# Patient Record
Sex: Male | Born: 1990 | Hispanic: Refuse to answer | Marital: Single | Smoking: Never smoker
Health system: Southern US, Community
[De-identification: ages and names within clinical notes are randomized; demographics above are authoritative.]

## PROBLEM LIST (undated history)

## (undated) DIAGNOSIS — H7290 Unspecified perforation of tympanic membrane, unspecified ear: Secondary | ICD-10-CM

## (undated) DIAGNOSIS — F845 Asperger's syndrome: Secondary | ICD-10-CM

## (undated) HISTORY — DX: Unspecified perforation of tympanic membrane, unspecified ear: H72.90

## (undated) HISTORY — PX: CIRCUMCISION: SUR203

## (undated) HISTORY — PX: GANGLION CYST EXCISION: SHX1691

## (undated) HISTORY — DX: Asperger's syndrome: F84.5

---
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
## 2009-01-05 ENCOUNTER — Ambulatory Visit: Payer: Self-pay | Admitting: Internal Medicine

## 2009-01-05 DIAGNOSIS — F900 Attention-deficit hyperactivity disorder, predominantly inattentive type: Secondary | ICD-10-CM | POA: Insufficient documentation

## 2009-01-05 LAB — CONVERTED CEMR LAB
ALT: 12 units/L (ref 0–53)
Albumin: 4.8 g/dL (ref 3.5–5.2)
BUN: 9 mg/dL (ref 6–23)
Basophils Absolute: 0 10*3/uL (ref 0.0–0.1)
CO2: 32 meq/L (ref 19–32)
Chloride: 103 meq/L (ref 96–112)
Cholesterol: 113 mg/dL (ref 0–200)
Glucose, Bld: 74 mg/dL (ref 70–99)
HCT: 45.3 % (ref 39.0–52.0)
Hemoglobin, Urine: NEGATIVE
LDL Cholesterol: 58 mg/dL (ref 0–99)
Lymphs Abs: 2 10*3/uL (ref 0.7–4.0)
MCV: 95.4 fL (ref 78.0–100.0)
Monocytes Absolute: 0.5 10*3/uL (ref 0.1–1.0)
Neutro Abs: 2.6 10*3/uL (ref 1.4–7.7)
Nitrite: NEGATIVE
Platelets: 272 10*3/uL (ref 150.0–400.0)
Potassium: 4.6 meq/L (ref 3.5–5.1)
RDW: 12.9 % (ref 11.5–14.6)
Total Bilirubin: 1 mg/dL (ref 0.3–1.2)
Total Protein, Urine: 30 mg/dL
Triglycerides: 133 mg/dL (ref 0.0–149.0)
Urine Glucose: NEGATIVE mg/dL
VLDL: 26.6 mg/dL (ref 0.0–40.0)
pH: 7 (ref 5.0–8.0)

## 2010-10-14 NOTE — Op Note (Signed)
[facility]  Patient:    Craig Weeks                      MRN: [id number] Proc. Date: 06/29/99 Adm. Date:  [id number] Disc. Date: [id number] Attending:  Katherine Roan CC:         Elana Alm. Eliezer Lofts., M.D.                           Operative Report  PREOPERATIVE DIAGNOSIS:  Phimosis.  POSTOPERATIVE DIAGNOSIS:  Phimosis.  OPERATION PERFORMED:  Circumcision.  SURGEON:  Rozanna Boer., M.D.  ANESTHESIA:  General.  INDICATIONS FOR PROCEDURE:  This 20-year-old white male was admitted with phimosis for elective circumcision.  He had his first and only urinary tract infection in December but does have dysuria, split stream with urination off and on.  He was  eneuretic until August 6.  He is admitted now for elective circumcision.  DESCRIPTION OF PROCEDURE:  The patient was placed on the operating table in supine position.  After satisfactory induction of general anesthesia, he was prepped and draped in the usual sterile fashion.  The redundant skin was marked off with a marking pen, incised circumferentially and then a dorsal slit was done and the rep was completed.  Another circumferential incision was made around the coronal sulcus and the redundant skin was then removed and hemostasis achieved by electrocoagulation with a Bovie with a fine needle electrode.  The edges of the  skin were then reapproximated with interrupted 5-0 chromic catgut suture and the base of the penis was infiltrated with 0.5 cc of 0.25% Marcaine. A dressing of collodion, Vaseline gauze, sterile dry bandage and Coban was applied.  The patient was taken to the recovery room in good condition and will be later discharged as an outpatient with detailed written instructions. DD:  06/29/99 TD:  06/29/99 Job: [id number]

## 2012-05-01 ENCOUNTER — Other Ambulatory Visit (INDEPENDENT_AMBULATORY_CARE_PROVIDER_SITE_OTHER): Payer: BC Managed Care – PPO

## 2012-05-01 ENCOUNTER — Ambulatory Visit: Payer: BC Managed Care – PPO

## 2012-05-01 ENCOUNTER — Encounter: Payer: Self-pay | Admitting: Internal Medicine

## 2012-05-01 ENCOUNTER — Ambulatory Visit (INDEPENDENT_AMBULATORY_CARE_PROVIDER_SITE_OTHER): Payer: BC Managed Care – PPO | Admitting: Internal Medicine

## 2012-05-01 ENCOUNTER — Other Ambulatory Visit: Payer: Self-pay | Admitting: Internal Medicine

## 2012-05-01 VITALS — BP 118/78 | HR 75 | Temp 98.6°F | Ht 73.0 in | Wt 197.2 lb

## 2012-05-01 DIAGNOSIS — F988 Other specified behavioral and emotional disorders with onset usually occurring in childhood and adolescence: Secondary | ICD-10-CM

## 2012-05-01 DIAGNOSIS — F411 Generalized anxiety disorder: Secondary | ICD-10-CM

## 2012-05-01 DIAGNOSIS — F419 Anxiety disorder, unspecified: Secondary | ICD-10-CM

## 2012-05-01 DIAGNOSIS — F848 Other pervasive developmental disorders: Secondary | ICD-10-CM

## 2012-05-01 DIAGNOSIS — Z0000 Encounter for general adult medical examination without abnormal findings: Secondary | ICD-10-CM

## 2012-05-01 DIAGNOSIS — F845 Asperger's syndrome: Secondary | ICD-10-CM

## 2012-05-01 DIAGNOSIS — R945 Abnormal results of liver function studies: Secondary | ICD-10-CM

## 2012-05-01 DIAGNOSIS — R7401 Elevation of levels of liver transaminase levels: Secondary | ICD-10-CM

## 2012-05-01 DIAGNOSIS — F840 Autistic disorder: Secondary | ICD-10-CM | POA: Insufficient documentation

## 2012-05-01 HISTORY — DX: Asperger's syndrome: F84.5

## 2012-05-01 LAB — CBC WITH DIFFERENTIAL/PLATELET
Basophils Absolute: 0 10*3/uL (ref 0.0–0.1)
Eosinophils Absolute: 0.1 10*3/uL (ref 0.0–0.7)
Lymphs Abs: 1.8 10*3/uL (ref 0.7–4.0)
MCHC: 34 g/dL (ref 30.0–36.0)
MCV: 91.4 fl (ref 78.0–100.0)
Monocytes Absolute: 0.5 10*3/uL (ref 0.1–1.0)
Neutrophils Relative %: 59 % (ref 43.0–77.0)
Platelets: 322 10*3/uL (ref 150.0–400.0)
RDW: 14.1 % (ref 11.5–14.6)
WBC: 5.9 10*3/uL (ref 4.5–10.5)

## 2012-05-01 LAB — BASIC METABOLIC PANEL
BUN: 11 mg/dL (ref 6–23)
Chloride: 103 mEq/L (ref 96–112)
Potassium: 4.1 mEq/L (ref 3.5–5.1)
Sodium: 139 mEq/L (ref 135–145)

## 2012-05-01 LAB — URINALYSIS, ROUTINE W REFLEX MICROSCOPIC
Bilirubin Urine: NEGATIVE
Hgb urine dipstick: NEGATIVE
Nitrite: NEGATIVE
Urobilinogen, UA: 0.2 (ref 0.0–1.0)

## 2012-05-01 LAB — LIPID PANEL
Cholesterol: 153 mg/dL (ref 0–200)
LDL Cholesterol: 100 mg/dL — ABNORMAL HIGH (ref 0–99)

## 2012-05-01 LAB — HEPATIC FUNCTION PANEL
ALT: 135 U/L — ABNORMAL HIGH (ref 0–53)
AST: 88 U/L — ABNORMAL HIGH (ref 0–37)
Alkaline Phosphatase: 103 U/L (ref 39–117)
Bilirubin, Direct: 0.1 mg/dL (ref 0.0–0.3)
Total Bilirubin: 0.5 mg/dL (ref 0.3–1.2)

## 2012-05-01 NOTE — Assessment & Plan Note (Signed)
With anxiety and ADD, pt requests referral to [facility] in [place], has tried to self -refer and unable to do this without paper referral per mom

## 2012-05-01 NOTE — Assessment & Plan Note (Signed)

## 2012-05-01 NOTE — Progress Notes (Signed)
Subjective:    Patient ID: Craig Weeks, male    DOB: Jun 28, 1990, 21 y.o.   MRN: [id number]  HPI  Here for wellness and f/u;  Overall doing ok;  Pt denies CP, worsening SOB, DOE, wheezing, orthopnea, PND, worsening LE edema, palpitations, dizziness or syncope.  Pt denies neurological change such as new Headache, facial or extremity weakness.  Pt denies polydipsia, polyuria, or low sugar symptoms. Pt states overall good compliance with treatment and medications, good tolerability, and trying to follow lower cholesterol diet.  Pt denies worsening depressive symptoms, suicidal ideation or panic. No fever, wt loss, night sweats, loss of appetite, or other constitutional symptoms.  Pt states good ability with ADL's, low fall risk, home safety reviewed and adequate, no significant changes in hearing or vision, and occasionally active with exercise.  Asks for psychiatry as he and mother have become unhappy with current.  Does not need daytrana rx for now Past Medical History  Diagnosis Date  . Asperger's disorder 05/01/2012   Past Surgical History  Procedure Date  . Ganglion cyst excision     right    reports that he has never smoked. He has never used smokeless tobacco. He reports that he drinks alcohol. He reports that he does not use illicit drugs. family history includes ADD / ADHD in his father. Allergies  Allergen Reactions  . Milk-Related Compounds     REACTION: gas,bloating, stomach hurts   Current Outpatient Prescriptions on File Prior to Visit  Medication Sig Dispense Refill  . methylphenidate (DAYTRANA) 30 MG/9HR Place 1 patch onto the skin daily. wear patch for 9 hours only each day       Review of Systems Review of Systems  Constitutional: Negative for diaphoresis, activity change, appetite change and unexpected weight change.  HENT: Negative for hearing loss, ear pain, facial swelling, mouth sores and neck stiffness.   Eyes: Negative for pain, redness and visual disturbance.   Respiratory: Negative for shortness of breath and wheezing.   Cardiovascular: Negative for chest pain and palpitations.  Gastrointestinal: Negative for diarrhea, blood in stool, abdominal distention and rectal pain.  Genitourinary: Negative for hematuria, flank pain and decreased urine volume.  Musculoskeletal: Negative for myalgias and joint swelling.  Skin: Negative for color change and wound.  Neurological: Negative for syncope and numbness.  Hematological: Negative for adenopathy.  Psychiatric/Behavioral: Negative for hallucinations, self-injury, decreased concentration and agitation.      Objective:   Physical Exam BP 118/78  Pulse 75  Temp 98.6 F (37 C) (Oral)  Ht 6\' 1"  (1.854 m)  Wt 197 lb 4 oz (89.472 kg)  BMI 26.02 kg/m2  SpO2 96% Physical Exam  VS noted Constitutional: Pt is oriented to person, place, and time. Appears well-developed and well-nourished.  HENT:  Head: Normocephalic and atraumatic.  Right Ear: External ear normal.  Left Ear: External ear normal.  Nose: Nose normal.  Mouth/Throat: Oropharynx is clear and moist.  Eyes: Conjunctivae and EOM are normal. Pupils are equal, round, and reactive to light.  Neck: Normal range of motion. Neck supple. No JVD present. No tracheal deviation present.  Cardiovascular: Normal rate, regular rhythm, normal heart sounds and intact distal pulses.   Pulmonary/Chest: Effort normal and breath sounds normal.  Abdominal: Soft. Bowel sounds are normal. There is no tenderness.  Musculoskeletal: Normal range of motion. Exhibits no edema.  Lymphadenopathy:  Has no cervical adenopathy.  Neurological: Pt is alert and oriented to person, place, and time. Pt has normal reflexes. No cranial  nerve deficit.  Skin: Skin is warm and dry. No rash noted.  Psychiatric:  Has  normal mood and affect. Behavior is normal. except 1+ nervous    Assessment & Plan:

## 2012-05-01 NOTE — Patient Instructions (Addendum)
Please consider returning if you change your mind about the flu shot Continue all other medications as before Please continue your efforts at being more active, low cholesterol diet, and weight control. You are otherwise up to date with prevention measures Please go to LAB in the Basement for the blood and/or urine tests to be done today You will be contacted by phone if any changes need to be made immediately.  Otherwise, you will receive a letter about your results with an explanation, but please check with MyChart first. Thank you for enrolling in MyChart.  MyChart allows you to send messages to your doctor, view your test results, renew your prescriptions, schedule appointments, and more. To Log into MyChart, please go to [email or web address], and your Username is:  [email or web address] You will be contacted regarding the referral for: [facility] in [place] Please return in 1 year for your yearly visit, or sooner if needed, with Lab testing done 3-5 days before

## 2012-05-02 LAB — HEPATITIS PANEL, ACUTE
HCV Ab: NEGATIVE
Hep A IgM: NEGATIVE
Hepatitis B Surface Ag: NEGATIVE

## 2012-05-27 ENCOUNTER — Ambulatory Visit
Admission: RE | Admit: 2012-05-27 | Discharge: 2012-05-27 | Disposition: A | Discharge status: Home / Self Care | Payer: BC Managed Care – PPO | Source: Ambulatory Visit | Attending: Internal Medicine | Admitting: Internal Medicine

## 2012-05-27 DIAGNOSIS — R945 Abnormal results of liver function studies: Secondary | ICD-10-CM

## 2012-05-30 ENCOUNTER — Telehealth: Payer: Self-pay | Admitting: *Deleted

## 2012-05-30 NOTE — Telephone Encounter (Signed)
See previous results note robin has called...Raechel Chute

## 2012-05-30 NOTE — Telephone Encounter (Signed)
Left msg on triage wanting results back from [facility]/S.../lmb

## 2012-06-03 ENCOUNTER — Ambulatory Visit (HOSPITAL_COMMUNITY): Payer: BC Managed Care – PPO | Admitting: Psychiatry

## 2012-06-24 ENCOUNTER — Ambulatory Visit (HOSPITAL_COMMUNITY): Payer: BC Managed Care – PPO | Admitting: Psychiatry

## 2012-06-25 ENCOUNTER — Encounter (HOSPITAL_COMMUNITY): Payer: Self-pay | Admitting: Psychiatry

## 2012-06-25 ENCOUNTER — Ambulatory Visit (INDEPENDENT_AMBULATORY_CARE_PROVIDER_SITE_OTHER): Payer: BC Managed Care – PPO | Admitting: Psychiatry

## 2012-06-25 VITALS — BP 107/67 | HR 65 | Ht 73.5 in | Wt 203.0 lb

## 2012-06-25 DIAGNOSIS — F988 Other specified behavioral and emotional disorders with onset usually occurring in childhood and adolescence: Secondary | ICD-10-CM

## 2012-06-25 DIAGNOSIS — F840 Autistic disorder: Secondary | ICD-10-CM

## 2012-06-25 DIAGNOSIS — F900 Attention-deficit hyperactivity disorder, predominantly inattentive type: Secondary | ICD-10-CM

## 2012-06-25 MED ORDER — METHYLPHENIDATE 30 MG/9HR TD PTCH: 1.0000 | MEDICATED_PATCH | Freq: Every day | TRANSDERMAL | Status: DC

## 2012-06-25 NOTE — Progress Notes (Signed)
Psychiatric Assessment Adult  Patient Identification:  Craig Weeks Date of Evaluation:  06/25/2012 Chief Complaint:   History of Chief Complaint:   Chief Complaint  Patient presents with  . Other    Asperger's    HPI Comments: Craig Weeks is a 22  y/o male with a past psychiatric history significant for Asperger's Disorder, and Attention Deficit Hyperactivity, disorder Inattentive type. The patient is referred for psychiatric services for psychiatric evaluation and medication management.   The patient reports that his main stressors are: "work"-having difficulty with work he has a problems with checking behaviors which has interfered with work. The patient reports he is looking forward to saving enough money to move out of his.   In the area of affective symptoms, patient appears . Patient denies current suicidal ideation, intent, or plan. Patient denies current homicidal ideation, intent, or plan. Patient denies auditory hallucinations. Patient denies visual hallucinations. Patient denies symptoms of paranoia. Patient states sleep is good, with approximately 10 hours of sleep per night. Appetite is decreased when medication is working. Energy level is poor. Patient denies symptoms of anhedonia. Patient denies hopelessness, helplessness, or guilt.   Denies any recent episodes consistent with mania, particularly decreased need for sleep with increased energy, grandiosity, impulsivity, hyperverbal and pressured speech, or increased productivity.  Denies any recent symptoms consistent with psychosis, particularly auditory or visual hallucinations, thought broadcasting/insertion/withdrawal, or ideas of reference. He reports one episode of excessive worry to the point of physical symptoms and panic attacks at a convention-[employer].  Denies any history of trauma or symptoms consistent with PTSD such as flashbacks, nightmares, hypervigilance, feelings of numbness or inability to connect with  others.    Review of Systems  Constitutional: Positive for appetite change. Negative for fever, chills and fatigue.  Respiratory: Negative for cough, chest tightness and shortness of breath.   Cardiovascular: Negative for chest pain, palpitations and leg swelling.  Gastrointestinal: Negative for nausea, vomiting, abdominal pain, diarrhea and constipation.  Neurological: Negative for dizziness, light-headedness and headaches.   Filed Vitals:   06/25/12 1401  BP: 107/67  Pulse: 65  Height: 6' 1.5" (1.867 m)  Weight: 203 lb (92.08 kg)   Physical Exam  Vitals reviewed. Constitutional: He appears well-developed and well-nourished. No distress.  Skin: He is not diaphoretic.   Traumatic Brain Injury: Negative   Past Psychiatric History: Diagnosis: Asperger's Disorder-at age 12, ADHD  Hospitalizations: Patient denies.  Outpatient Care: Patient reports that his only psychiatrist was Dr. Tomasa Weeks  Substance Abuse Care: Patient denies.  Self-Mutilation: Patient denies.  Suicidal Attempts: Patient denies.  Violent Behaviors: Age 89 fight.   Past Medical History:   Past Medical History  Diagnosis Date  . Asperger's disorder 05/01/2012   History of Loss of Consciousness:  Negative Seizure History:  Negative Cardiac History:  Negative Allergies:   Allergies  Allergen Reactions  . Milk-Related Compounds     REACTION: gas,bloating, stomach hurts   Current Medications:  Current Outpatient Prescriptions  Medication Sig Dispense Refill  . methylphenidate (DAYTRANA) 30 MG/9HR Place 1 patch onto the skin daily. wear patch for 9 hours only each day        Previous Psychotropic Medications:  Medication   Daytrana  Concerta   Substance Abuse History in the last 12 months: Caffeine: Patient denies. Tobacco: Patient denies. Alcohol: Patient denies. Illicit Drugs: Patient denies.   Medical Consequences of Substance Abuse:Patient denies.  Legal Consequences of Substance Abuse:  Patient denies.  Family Consequences of Substance Abuse: Patient denies.  Blackouts:  Negative DT's:  Not Applicable Withdrawal Symptoms:  Not Applicable  Social History: Current Place of Residence: [place], [place] Place of Birth: [place], [place] Family Members: Mother, father, 1 older brothers Marital Status:  Single Children: None Relationships: Patient reports he has no main source of emotional support Education:  HS Graduate Educational Problems/Performance: Did fair in school. Religious Beliefs/Practices: None History of Abuse: none Occupational Experiences; Military History:  None. Legal History:Patient  Hobbies/Interests: Tabletop games.  Family History:   Family History  Problem Relation Age of Onset  . ADD / ADHD Father   . Prostate cancer Maternal Grandfather   . Bipolar disorder Maternal Grandmother   . Alcohol abuse Maternal Grandmother   . Dementia Paternal Grandfather   . 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 Affirm with the patient that the medications are taken as ordered. Patient expressed understanding of how their medications were to be used.  2. Continue the following psychiatric medications as written prior to this appointment with the following changes:  a) Continue Daytrana 40 mg daily. 3. Therapy: brief supportive therapy provided. Continue current services.  4. Risks and benefits, side effects and alternatives discussed with patient, he was given an opportunity to ask questions about his/her medication, illness, and treatment. All current psychiatric medications have been reviewed and discussed with the patient and adjusted as clinically appropriate. The patient has been provided an accurate and updated list of the medications being now prescribed.  5. Patient told to call clinic if any problems occur. Patient advised to go to ER  if she should develop SI/HI, side effects, or if symptoms worsen. Has crisis numbers to call if needed.   6. Will perform UDS. 7. The patient was encouraged to keep all PCP and specialty clinic appointments.  8. Patient was instructed to return to clinic in 1 month.  9. The patient was advised to call and cancel their mental health appointment within 24 hours of the appointment, if they are unable to keep the appointment, as well as the three no show and termination from clinic policy. 10. The patient expressed understanding of the plan and agrees with the above.  Jacqulyn Cane, MD 1/28/20142:16 PM

## 2012-06-26 LAB — DRUG SCREEN, URINE
Amphetamine Screen, Ur: NEGATIVE
Benzodiazepines.: NEGATIVE
Creatinine,U: 35.05 mg/dL
Methadone: NEGATIVE
Propoxyphene: NEGATIVE

## 2012-07-08 ENCOUNTER — Ambulatory Visit (INDEPENDENT_AMBULATORY_CARE_PROVIDER_SITE_OTHER): Payer: BC Managed Care – PPO | Admitting: Psychiatry

## 2012-07-08 ENCOUNTER — Encounter (HOSPITAL_COMMUNITY): Payer: Self-pay | Admitting: Psychiatry

## 2012-07-08 VITALS — BP 129/77 | HR 70 | Ht 73.6 in | Wt 204.0 lb

## 2012-07-08 DIAGNOSIS — F900 Attention-deficit hyperactivity disorder, predominantly inattentive type: Secondary | ICD-10-CM

## 2012-07-08 DIAGNOSIS — F988 Other specified behavioral and emotional disorders with onset usually occurring in childhood and adolescence: Secondary | ICD-10-CM

## 2012-07-08 DIAGNOSIS — F840 Autistic disorder: Secondary | ICD-10-CM

## 2012-07-08 MED ORDER — METHYLPHENIDATE 30 MG/9HR TD PTCH: 1.0000 | MEDICATED_PATCH | Freq: Every day | TRANSDERMAL | Status: DC

## 2012-07-08 NOTE — Progress Notes (Signed)
[facility] Follow-up Outpatient Visit  TANNON PEERSON [birth year]  Date: 07/08/2012  History of Chief Complaint:   Chief Complaint  Patient presents with  . Follow-up     HPI Comments: Mr. Prosch is a 22  y/o male with a past psychiatric history significant for Asperger's Disorder, and Attention Deficit Hyperactivity, disorder Inattentive type. The patient is referred for psychiatric services for medication management.   The patient reports he is dong well at work. He reports he is taking his medications and denies an side effects. He continues to work and denies any problems at work. He is saving money in hopes of moving out of his house and living on his own.  In the area of affective symptoms, patient appears . Patient denies current suicidal ideation, intent, or plan. Patient denies current homicidal ideation, intent, or plan. Patient denies auditory hallucinations. Patient denies visual hallucinations. Patient denies symptoms of paranoia. Patient states sleep is good, with approximately 8-10 hours of sleep per night. Appetite is decreased when medication is working. Energy level is good. Patient denies symptoms of anhedonia. Patient denies hopelessness, helplessness, or guilt.   Denies any recent episodes consistent with mania, particularly decreased need for sleep with increased energy, grandiosity, impulsivity, hyperverbal and pressured speech, or increased productivity.  Denies any recent symptoms consistent with psychosis, particularly auditory or visual hallucinations, thought broadcasting/insertion/withdrawal, or ideas of reference. He reports one episode of excessive worry to the point of physical symptoms and panic attacks.  Denies any history of trauma or symptoms consistent with PTSD such as flashbacks, nightmares, hypervigilance, feelings of numbness or inability to connect with others.   Spoke with the patient's mother for collateral information. She denies any  concerns.  She requests a 90 day supply prescription due to their insurance.  Review of Systems  Constitutional: Positive for appetite change. Negative for fever, chills and fatigue.  Respiratory: Negative for cough, chest tightness and shortness of breath.   Cardiovascular: Negative for chest pain, palpitations and leg swelling.  Gastrointestinal: Negative for nausea, vomiting, abdominal pain, diarrhea and constipation.  Neurological: Negative for dizziness, light-headedness and headaches.   Filed Vitals:   07/08/12 1551  BP: 129/77  Pulse: 70  Height: 6' 1.6" (1.869 m)  Weight: 204 lb (92.534 kg)   Physical Exam  Vitals reviewed. Constitutional: He appears well-developed and well-nourished. No distress.  Skin: He is not diaphoretic.   Traumatic Brain Injury: Negative   Past Psychiatric History: Diagnosis: Asperger's Disorder-at age 57, ADHD  Hospitalizations: Patient denies.  Outpatient Care: Patient reports that his only psychiatrist was Dr. Tomasa Rand  Substance Abuse Care: Patient denies.  Self-Mutilation: Patient denies.  Suicidal Attempts: Patient denies.  Violent Behaviors: Age 22 fight.   Past Medical History:   Past Medical History  Diagnosis Date  . Asperger's disorder 05/01/2012   History of Loss of Consciousness:  Negative Seizure History:  Negative Cardiac History:  Negative Allergies:   Allergies  Allergen Reactions  . Milk-Related Compounds     REACTION: gas,bloating, stomach hurts   Current Medications:  Current Outpatient Prescriptions  Medication Sig Dispense Refill  . methylphenidate (DAYTRANA) 30 MG/9HR Place 1 patch onto the skin daily. wear patch for 9 hours only each day        Previous Psychotropic Medications:  Medication   Daytrana  Concerta   Substance Abuse History in the last 12 months: Caffeine: Patient denies. Tobacco: Patient denies. Alcohol: Patient denies. Illicit Drugs: Patient denies.   Medical Consequences of  Substance Abuse:Patient denies.  Legal Consequences of Substance Abuse: Patient denies.  Family Consequences of Substance Abuse: Patient denies.  Blackouts:  Negative DT's:  Not Applicable Withdrawal Symptoms:  Not Applicable  Social History: Reviewed Current Place of Residence: [place], [place] Place of Birth: [place], [place] Family Members: Mother, father, 1 older brothers Marital Status:  Single Children: None Relationships: Patient reports he has no main source of emotional support Education:  HS Graduate Educational Problems/Performance: Did fair in school. Religious Beliefs/Practices: None History of Abuse: none Occupational Experiences; Military History:  None. Legal History:Patient  Hobbies/Interests: Tabletop games.  Family History:  Reviewed Family History  Problem Relation Age of Onset  . ADD / ADHD Father   . Prostate cancer Maternal Grandfather   . Bipolar disorder Maternal Grandmother   . Alcohol abuse Maternal Grandmother   . Dementia Paternal Grandfather   . 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 Affirm with the patient that the medications are taken as ordered. Patient expressed understanding of how their medications were to be used.  2. Continue the following psychiatric medications as written prior to this appointment with the following changes:  a) Continue Daytrana 40 mg daily. Provided 90 day supply.  Parents requested 90 day supply due to their insurance. 3. Therapy: brief supportive therapy provided. Continue current services.  4. Risks and benefits, side effects and alternatives discussed with patient, he was given an opportunity to ask questions about his medication, illness, and treatment. All current psychiatric medications have been reviewed and discussed with the patient and adjusted as clinically appropriate. The patient has been provided an accurate and updated list of the medications being now prescribed.  5. Patient told to call clinic if any problems occur. Patient advised to go to ER  if she should develop SI/HI, side effects, or if symptoms worsen. Has crisis numbers to call if needed.   6. Reviewed lab results. Will perform random UDS as long as patient is on Daytrana. 7. The patient was encouraged to keep all PCP and specialty clinic appointments.  8. Patient was instructed to return to clinic in 1 month.  9. The patient was advised to call and cancel their mental health appointment within 24 hours of the appointment, if they are unable to keep the appointment, as well as the three no show and termination from clinic policy. 10. The patient expressed understanding of the plan and agrees with the above.  Jacqulyn Cane, MD 1/28/20142:16 PM

## 2012-07-13 ENCOUNTER — Other Ambulatory Visit: Payer: Self-pay

## 2012-09-04 ENCOUNTER — Other Ambulatory Visit (INDEPENDENT_AMBULATORY_CARE_PROVIDER_SITE_OTHER): Payer: BC Managed Care – PPO

## 2012-09-04 DIAGNOSIS — R945 Abnormal results of liver function studies: Secondary | ICD-10-CM

## 2012-09-04 DIAGNOSIS — R7989 Other specified abnormal findings of blood chemistry: Secondary | ICD-10-CM

## 2012-09-04 LAB — HEPATIC FUNCTION PANEL
ALT: 27 U/L (ref 0–53)
Bilirubin, Direct: 0.1 mg/dL (ref 0.0–0.3)
Total Bilirubin: 0.6 mg/dL (ref 0.3–1.2)

## 2012-09-05 ENCOUNTER — Encounter (HOSPITAL_COMMUNITY): Payer: Self-pay | Admitting: Psychiatry

## 2012-09-05 ENCOUNTER — Ambulatory Visit (INDEPENDENT_AMBULATORY_CARE_PROVIDER_SITE_OTHER): Payer: BC Managed Care – PPO | Admitting: Psychiatry

## 2012-09-05 VITALS — BP 93/65 | HR 61 | Ht 73.6 in | Wt 197.0 lb

## 2012-09-05 DIAGNOSIS — F900 Attention-deficit hyperactivity disorder, predominantly inattentive type: Secondary | ICD-10-CM

## 2012-09-05 DIAGNOSIS — F840 Autistic disorder: Secondary | ICD-10-CM

## 2012-09-05 DIAGNOSIS — F988 Other specified behavioral and emotional disorders with onset usually occurring in childhood and adolescence: Secondary | ICD-10-CM

## 2012-09-05 MED ORDER — METHYLPHENIDATE 30 MG/9HR TD PTCH: 1.0000 | MEDICATED_PATCH | Freq: Every day | TRANSDERMAL | Status: DC

## 2012-09-05 NOTE — Progress Notes (Signed)
[facility] Follow-up Outpatient Visit  Craig Weeks 06-01-1990  Date: 09/05/2012  History of Chief Complaint:   Chief Complaint  Patient presents with  . Follow-up   HPI Comments: Mr. Sondgeroth is a 22  y/o male with a past psychiatric history significant for Asperger's Disorder, and Attention Deficit Hyperactivity, disorder Inattentive type. The patient is referred for psychiatric services for medication management.   The patient reports he is doing well.  He reports he made a mistake at work which was relatively minor though the patient does feel bad about it. He reports he is going to start going to the gym. He is planning to start a Dungeons and Dragons role playing group. The only abnormal movement is rapid tapping of his fingers with extreme emotion.  He states he is taking his medication and denies any side effects.   In the area of affective symptoms, patient appears . Patient denies current suicidal ideation, intent, or plan. Patient denies current homicidal ideation, intent, or plan. Patient denies auditory hallucinations. Patient denies visual hallucinations. Patient denies symptoms of paranoia. Patient states sleep is good, with approximately 8-10 hours of sleep per night. Appetite is decreased when medication is working. Energy level is good. Patient denies symptoms of anhedonia. Patient denies hopelessness, helplessness, or guilt.   Denies any recent episodes consistent with mania, particularly decreased need for sleep with increased energy, grandiosity, impulsivity, hyperverbal and pressured speech, or increased productivity.  Denies any recent symptoms consistent with psychosis, particularly auditory or visual hallucinations, thought broadcasting/insertion/withdrawal, or ideas of reference. He reports one episode of excessive worry to the point of physical symptoms and panic attacks.  Denies any history of trauma or symptoms consistent with PTSD such as flashbacks,  nightmares, hypervigilance, feelings of numbness or inability to connect with others.   Spoke with the patient's mother for collateral information. She denies any concerns.  She requests a 90 day supply prescription due to their insurance.  Review of Systems  Constitutional: Positive for appetite change. Negative for fever, chills and fatigue.  Respiratory: Negative for cough, chest tightness and shortness of breath.   Cardiovascular: Negative for chest pain, palpitations and leg swelling.  Gastrointestinal: Negative for nausea, vomiting, abdominal pain, diarrhea and constipation.  Neurological: Negative for dizziness, light-headedness and headaches.   Filed Vitals:   09/05/12 1504  BP: 93/65  Pulse: 61  Height: 6' 1.6" (1.869 m)  Weight: 197 lb (89.359 kg)   Physical Exam  Vitals reviewed. Constitutional: He appears well-developed and well-nourished. No distress.  Skin: He is not diaphoretic.   Traumatic Brain Injury: Negative   Past Psychiatric History:Reviewed Diagnosis: Asperger's Disorder-at age 48, ADHD  Hospitalizations: Patient denies.  Outpatient Care: Patient reports that his only psychiatrist was Dr. Tomasa Rand  Substance Abuse Care: Patient denies.  Self-Mutilation: Patient denies.  Suicidal Attempts: Patient denies.  Violent Behaviors: Age 57 fight.   Past Medical History:  Reviewed Past Medical History  Diagnosis Date  . Asperger's disorder 05/01/2012    History of Loss of Consciousness:  Negative Seizure History:  Negative Cardiac History:  Negative  Allergies: Reviewed Allergies  Allergen Reactions  . Milk-Related Compounds     REACTION: gas,bloating, stomach hurts     Current Medications: Reviewed Current Outpatient Prescriptions  Medication Sig Dispense Refill  . methylphenidate (DAYTRANA) 30 MG/9HR Place 1 patch onto the skin daily. wear patch for 9 hours only each day        Previous Psychotropic Medications:Reviewed  Medication    Daytrana  Concerta   Substance Abuse History in the last 12 months: Caffeine: Patient denies. Tobacco: Patient denies. Alcohol: Patient denies. Illicit Drugs: Patient denies.   Medical Consequences of Substance Abuse:Patient denies.  Legal Consequences of Substance Abuse: Patient denies.  Family Consequences of Substance Abuse: Patient denies.  Blackouts:  Negative DT's:  Not Applicable Withdrawal Symptoms:  Not Applicable  Social History: Reviewed Current Place of Residence: [place], [place] Place of Birth: [place], [place] Family Members: Mother, father, 1 older brothers Marital Status:  Single Children: None Relationships: Patient reports he has no main source of emotional support Education:  HS Graduate Educational Problems/Performance: Did fair in school. 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 Legal History:Patient  Hobbies/Interests: Tabletop games such a IT trainer.   Family History:  Reviewed Family History  Problem Relation Age of Onset  . ADD / ADHD Father   . Prostate cancer Maternal Grandfather   . Bipolar disorder Maternal Grandmother   . Alcohol abuse Maternal Grandmother   . Dementia Paternal Grandfather   . Drug abuse Cousin     Psychiatric Specialty Examination: Objective:  Appearance: Casual  Eye Contact::  Poor  Speech:  Clear and Coherent and Normal Rate  Volume:  Normal  Mood:  "pretty good" 6/10  Affect:  Appropriate, Congruent and Full Range  Thought Process:  Coherent, Linear and Logical  Orientation:  Full (Time, Place, and Person)  Thought Content:  WDL  Suicidal Thoughts:  No  Homicidal Thoughts:  No  Judgement:  Fair  Insight:  Fair  Psychomotor Activity:  Increased  Akathisia:  Negative  Memory: 3/3 Immediate; 3/3 Recent.  Handed:  Right  AIMS (if indicated):  As noted in chart  Assets:  Financial  Resources/Insurance Housing Physical Health Social Support Transportation Vocational/Educational    Laboratory/X-Ray Psychological Evaluation(s)  Reviewed  None   Assessment:   AXIS I Autism Spectrum Disorder, Attention Deficit Hyperactivity Disorder, inattentive type  AXIS II No diagnosis  AXIS III No Diagnosis   AXIS IV other psychosocial or environmental problems  AXIS V 51-60 moderate symptoms   Treatment Plan/Recommendations:  PLAN:  1. Affirm with the patient that the medications are taken as ordered. Patient expressed understanding of how their medications were to be used.  2. Continue the following psychiatric medications as written prior to this appointment with the following changes:  a) Continue Daytrana 40 mg daily. Provided 90 day supply.  Parents requested 90 day supply due to their insurance. 3. Therapy: brief supportive therapy provided. Continue current services.  4. Risks and benefits, side effects and alternatives discussed with patient, he was given an opportunity to ask questions about his medication, illness, and treatment. All current psychiatric medications have been reviewed and discussed with the patient and adjusted as clinically appropriate. The patient has been provided an accurate and updated list of the medications being now prescribed.  5. Patient told to call clinic if any problems occur. Patient advised to go to ER  if she should develop SI/HI, side effects, or if symptoms worsen. Has crisis numbers to call if needed.   6. No labs warranted a this time. Will perform random UDS as long as patient is on Daytrana. 7. The patient was encouraged to keep all PCP and specialty clinic appointments.  8. Patient was instructed to return to clinic in 1 month.  9. The patient was advised to call and cancel their mental health appointment within 24 hours of the  appointment, if they are unable to keep the appointment, as well as the three no show and termination  from clinic policy. 10. The patient expressed understanding of the plan and agrees with the above.  Jacqulyn Cane, M.D.  09/05/2012 3:04 PM

## 2012-12-02 ENCOUNTER — Ambulatory Visit (INDEPENDENT_AMBULATORY_CARE_PROVIDER_SITE_OTHER): Payer: BC Managed Care – PPO | Admitting: Internal Medicine

## 2012-12-02 ENCOUNTER — Encounter: Payer: Self-pay | Admitting: Internal Medicine

## 2012-12-02 VITALS — BP 132/70 | HR 70 | Temp 99.2°F | Ht 74.0 in | Wt 205.0 lb

## 2012-12-02 DIAGNOSIS — H9311 Tinnitus, right ear: Secondary | ICD-10-CM | POA: Insufficient documentation

## 2012-12-02 DIAGNOSIS — F988 Other specified behavioral and emotional disorders with onset usually occurring in childhood and adolescence: Secondary | ICD-10-CM

## 2012-12-02 DIAGNOSIS — H9190 Unspecified hearing loss, unspecified ear: Secondary | ICD-10-CM

## 2012-12-02 DIAGNOSIS — H9192 Unspecified hearing loss, left ear: Secondary | ICD-10-CM | POA: Insufficient documentation

## 2012-12-02 DIAGNOSIS — H7290 Unspecified perforation of tympanic membrane, unspecified ear: Secondary | ICD-10-CM | POA: Insufficient documentation

## 2012-12-02 DIAGNOSIS — H7291 Unspecified perforation of tympanic membrane, right ear: Secondary | ICD-10-CM

## 2012-12-02 DIAGNOSIS — H9319 Tinnitus, unspecified ear: Secondary | ICD-10-CM

## 2012-12-02 DIAGNOSIS — F900 Attention-deficit hyperactivity disorder, predominantly inattentive type: Secondary | ICD-10-CM

## 2012-12-02 MED ORDER — AMOXICILLIN 500 MG PO CAPS: 1000.0000 mg | ORAL_CAPSULE | Freq: Two times a day (BID) | ORAL | Status: DC

## 2012-12-02 MED ORDER — AMOXICILLIN 250 MG/5ML PO SUSR: 500.0000 mg | Freq: Three times a day (TID) | ORAL | Status: DC

## 2012-12-02 NOTE — Assessment & Plan Note (Signed)
Improved after wax impaction irrigated

## 2012-12-02 NOTE — Assessment & Plan Note (Signed)
D/w pt, likely due to TM perf, for radio at night to get to sleep

## 2012-12-02 NOTE — Assessment & Plan Note (Signed)
For amoxil, ENT referral,  to f/u any worsening symptoms or concerns

## 2012-12-02 NOTE — Progress Notes (Signed)
  Subjective:    Patient ID: Craig Weeks, male    DOB: September 25, 1990, 22 y.o.   MRN: [id number]  HPI  Here to f/u after acute onset right ear pain and blood after used the right index finger in his ear after irrigation with peroxide to remove wax, and "popped" the ear with the finger with severe pain and persistent ringing.  Has ongoing decreased hearing left ear that did not respond to irrigation for 1 wk.   Pt denies fever, wt loss, night sweats, loss of appetite, or other constitutional symptoms  Pt denies chest pain, increased sob or doe, wheezing, orthopnea, PND, increased LE swelling, palpitations, dizziness or syncope.  Unable to take pill med due to difficulty swallowing and aspergers. Past Medical History  Diagnosis Date  . Asperger's disorder 05/01/2012   Past Surgical History  Procedure Laterality Date  . Ganglion cyst excision      right  . Circumcision      Age 63    reports that he has never smoked. He has never used smokeless tobacco. He reports that he does not drink alcohol or use illicit drugs. family history includes ADD / ADHD in his father; Alcohol abuse in his maternal grandmother; Bipolar disorder in his maternal grandmother; Dementia in his paternal grandfather; Drug abuse in his cousin; and Prostate cancer in his maternal grandfather. Allergies  Allergen Reactions  . Milk-Related Compounds     REACTION: gas,bloating, stomach hurts   Current Outpatient Prescriptions on File Prior to Visit  Medication Sig Dispense Refill  . methylphenidate (DAYTRANA) 30 MG/9HR Place 1 patch onto the skin daily. wear patch for 9 hours only each day  90 patch  0   No current facility-administered medications on file prior to visit.   Review of Systems  Constitutional: Negative for unexpected weight change, or unusual diaphoresis  HENT: Negative for tinnitus.   Eyes: Negative for photophobia and visual disturbance.  Respiratory: Negative for choking and stridor.   Gastrointestinal:  Negative for vomiting and blood in stool.  Genitourinary: Negative for hematuria and decreased urine volume.  Musculoskeletal: Negative for acute joint swelling Skin: Negative for color change and wound.  Neurological: Negative for tremors and numbness other than noted  Psychiatric/Behavioral: Negative for decreased concentration or  hyperactivity.       Objective:   Physical Exam BP 132/70  Pulse 70  Temp(Src) 99.2 F (37.3 C) (Oral)  Ht 6\' 2"  (1.88 m)  Wt 205 lb (92.987 kg)  BMI 26.31 kg/m2  SpO2 97% VS noted,  Constitutional: Pt appears well-developed and well-nourished.  HENT: Head: NCAT.  Right Ear: External ear normal.  Left Ear: External ear normal.  Right tm's with mild erythema but signficant blood and prob perf TM, left TM clear after left wax irrigated,  Max sinus areas non tender.  Pharynx with mild erythema, no exudate Eyes: Conjunctivae and EOM are normal. Pupils are equal, round, and reactive to light.  Neck: Normal range of motion. Neck supple.  Cardiovascular: Normal rate and regular rhythm.   Pulmonary/Chest: Effort normal and breath sounds normal.  Neurological: Pt is alert. Not confused  Skin: Skin is warm. No erythema.  Psychiatric: Pt behavior is normal. Thought content normal.     Assessment & Plan:

## 2012-12-02 NOTE — Assessment & Plan Note (Signed)
O/w stable overall by history and exam, and pt to continue medical treatment as before,  to f/u any worsening symptoms or concerns  

## 2012-12-02 NOTE — Patient Instructions (Signed)
Please take all new medication as prescribed - the antibiotic Please continue all other medications as before Your left ear was irrigated of wax today You will be contacted regarding the referral for: ENT for the ruptured right ear drum  Please remember to sign up for My Chart if you have not done so, as this will be important to you in the future with finding out test results, communicating by private email, and scheduling acute appointments online when needed.

## 2012-12-05 ENCOUNTER — Ambulatory Visit (HOSPITAL_COMMUNITY): Payer: Self-pay | Admitting: Psychiatry

## 2012-12-05 ENCOUNTER — Encounter (HOSPITAL_COMMUNITY): Payer: Self-pay | Admitting: Psychiatry

## 2012-12-05 ENCOUNTER — Ambulatory Visit (INDEPENDENT_AMBULATORY_CARE_PROVIDER_SITE_OTHER): Payer: BC Managed Care – PPO | Admitting: Psychiatry

## 2012-12-05 VITALS — BP 118/68 | HR 60 | Ht 74.0 in | Wt 205.0 lb

## 2012-12-05 DIAGNOSIS — F988 Other specified behavioral and emotional disorders with onset usually occurring in childhood and adolescence: Secondary | ICD-10-CM

## 2012-12-05 DIAGNOSIS — F900 Attention-deficit hyperactivity disorder, predominantly inattentive type: Secondary | ICD-10-CM

## 2012-12-05 MED ORDER — METHYLPHENIDATE 30 MG/9HR TD PTCH: 1.0000 | MEDICATED_PATCH | Freq: Every day | TRANSDERMAL | Status: DC

## 2012-12-05 NOTE — Progress Notes (Signed)
[facility] Follow-up Outpatient Visit  Craig Weeks 24-Jun-1990  Date: 12/05/2012  History of Chief Complaint:   Chief Complaint  Patient presents with  . Follow-up   HPI Comments: Craig Weeks is a 22  y/o male with a past psychiatric history significant for Asperger's Disorder, and Attention Deficit Hyperactivity, disorder Inattentive type. The patient is referred for psychiatric services for medication management.   The patient reports that he has been having some worsening of mood secondary to having perforated his eardrum. He states he has been having some ringing in his ear.  He reports he continues to work.  He reports that he has not been late to work as he goes to work later in the day and is going faster at work but not as fast as his coworkers. He is still living at home and hopes to be able move out.  The patient he is taking his medication and denies any side effects.   In the area of affective symptoms, patient appears . Patient denies current suicidal ideation, intent, or plan. Patient denies current homicidal ideation, intent, or plan. Patient denies auditory hallucinations. Patient denies visual hallucinations. Patient denies symptoms of paranoia. Patient states sleep is good, with approximately 6-7 ours of sleep per night. Appetite is decreased when medication is working. Energy level is good. Patient denies symptoms of anhedonia. Patient denies hopelessness, helplessness, or guilt.   Denies any recent episodes consistent with mania, particularly decreased need for sleep with increased energy, grandiosity, impulsivity, hyperverbal and pressured speech, or increased productivity.  Denies any recent symptoms consistent with psychosis, particularly auditory or visual hallucinations, thought broadcasting/insertion/withdrawal, or ideas of reference. He reports one episode of excessive worry to the point of physical symptoms and panic attacks.  Denies any history of  trauma or symptoms consistent with PTSD such as flashbacks, nightmares, hypervigilance, feelings of numbness or inability to connect with others.   Spoke with the patient's mother for collateral information. She denies any concerns.  She requests a 90 day supply prescription due to their insurance.  Review of Systems  Constitutional: Negative for fever, chills and fatigue or change of appetite.  Respiratory: Negative for cough, chest tightness and shortness of breath.   Cardiovascular: Negative for chest pain, palpitations and leg swelling.  Gastrointestinal: Negative for nausea, vomiting, abdominal pain, diarrhea and constipation.  Neurological: Negative for dizziness, light-headedness and headaches.   Filed Vitals:   12/05/12 1507  BP: 118/68  Pulse: 60  Height: 6\' 2"  (1.88 m)  Weight: 205 lb (92.987 kg)   Physical Exam  Vitals reviewed. Constitutional: He appears well-developed and well-nourished. No distress.  Skin: He is not diaphoretic.  Musculoskeletal: Strength & Muscle Tone: within normal limits Gait & Station: normal Patient leans: N/A  Traumatic Brain Injury: Negative   Past Psychiatric History:Reviewed Diagnosis: Asperger's Disorder-at age 40, ADHD  Hospitalizations: Patient denies.  Outpatient Care: Patient reports that his only psychiatrist was Dr. Tomasa Weeks  Substance Abuse Care: Patient denies.  Self-Mutilation: Patient denies.  Suicidal Attempts: Patient denies.  Violent Behaviors: Age 20 fight.   Past Medical History:  Reviewed Past Medical History  Diagnosis Date  . Asperger's disorder 05/01/2012  . Perforated eardrum     Right side    History of Loss of Consciousness:  Negative Seizure History:  Negative Cardiac History:  Negative  Allergies: Reviewed Allergies  Allergen Reactions  . Milk-Related Compounds     REACTION: gas,bloating, stomach hurts     Current Medications: Reviewed Current Outpatient  Prescriptions on File Prior to Visit   Medication Sig Dispense Refill  . amoxicillin (AMOXIL) 250 MG/5ML suspension Take 10 mLs (500 mg total) by mouth 3 (three) times daily.  300 mL  0  . methylphenidate (DAYTRANA) 30 MG/9HR Place 1 patch onto the skin daily. wear patch for 9 hours only each day  90 patch  0   No current facility-administered medications on file prior to visit.    Previous Psychotropic Medications:Reviewed  Medication   Daytrana  Concerta   Substance Abuse History in the last 12 months: Caffeine: Patient denies. Tobacco: Patient denies. Alcohol: Patient denies. Illicit Drugs: Patient denies.   Medical Consequences of Substance Abuse:Patient denies.  Legal Consequences of Substance Abuse: Patient denies.  Family Consequences of Substance Abuse: Patient denies.  Blackouts:  Negative DT's:  Not Applicable Withdrawal Symptoms:  Not Applicable  Social History: Reviewed Current Place of Residence: [place], [place] Place of Birth: [place], [place] Family Members: Mother, father, 1 older brothers Marital Status:  Single Children: None Relationships: Patient reports he has no main source of emotional support Education:  HS Graduate Educational Problems/Performance: Did fair in school. 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 Legal History:Patient  Hobbies/Interests: Tabletop games such a IT trainer.   Family History:  Reviewed Family History  Problem Relation Age of Onset  . ADD / ADHD Father   . Prostate cancer Maternal Grandfather   . Bipolar disorder Maternal Grandmother   . Alcohol abuse Maternal Grandmother   . Dementia Paternal Grandfather   . Drug abuse Cousin     Psychiatric Specialty Examination: Objective:  Appearance: Casual  Eye Contact::  Poor  Speech:  Clear and Coherent and Normal Rate  Volume:  Normal  Mood:  "okay" 4/10 (0=very depressed; 5= neutral; 10=very happy)    Affect:  Appropriate, Congruent and Full Range  Thought Process:  Coherent, Linear and Logical  Orientation:  Full (Time, Place, and Person)  Thought Content:  WDL  Suicidal Thoughts:  No  Homicidal Thoughts:  No  Judgement:  Fair  Insight:  Fair  Psychomotor Activity:  Increased  Akathisia:  Negative  Memory: 3/3 Immediate; 1/3 Recent.  Handed:  Right  AIMS (if indicated):  As noted in chart  Assets:  Financial Resources/Insurance Housing Physical Health Social Support Transportation Vocational/Educational    Laboratory/X-Ray Psychological Evaluation(s)  Reviewed  None   Assessment:   AXIS I Autism Spectrum Disorder, Attention Deficit Hyperactivity Disorder, inattentive type  AXIS II No diagnosis  AXIS III No Diagnosis   AXIS IV other psychosocial or environmental problems  AXIS V 51-60 moderate symptoms   Treatment Plan/Recommendations:  PLAN:  1. Affirm with the patient that the medications are taken as ordered. Patient expressed understanding of how their medications were to be used.  2. Continue the following psychiatric medications as written prior to this appointment with the following changes:  a) Continue Daytrana 40 mg daily. Provided 90 day supply.  Parents requested 90 day supply due to their insurance. 3. Therapy: brief supportive therapy provided. Continue current services.  4. Risks and benefits, side effects and alternatives discussed with patient, he was given an opportunity to ask questions about his medication, illness, and treatment. All current psychiatric medications have been reviewed and discussed with the patient and adjusted as clinically appropriate. The patient has been provided an accurate and updated list of the medications being now prescribed.  5. Patient told to call clinic if any  problems occur. Patient advised to go to ER  if she should develop SI/HI, side effects, or if symptoms worsen. Has crisis numbers to call if needed.   6. Will  perform random UDS. Will continue as long as patient is on Daytrana. 7. The patient was encouraged to keep all PCP and specialty clinic appointments.  8. Patient was instructed to return to clinic in 1 month.  9. The patient was advised to call and cancel their mental health appointment within 24 hours of the appointment, if they are unable to keep the appointment, as well as the three no show and termination from clinic policy. 10. The patient expressed understanding of the plan and agrees with the above.  Jacqulyn Cane, M.D.  12/05/2012 3:04 PM

## 2012-12-06 LAB — DRUGS OF ABUSE SCREEN W/O ALC, ROUTINE URINE
Amphetamine Screen, Ur: NEGATIVE
Cocaine Metabolites: NEGATIVE
Marijuana Metabolite: NEGATIVE
Opiate Screen, Urine: NEGATIVE
Phencyclidine (PCP): NEGATIVE
Propoxyphene: NEGATIVE

## 2013-02-12 ENCOUNTER — Ambulatory Visit (HOSPITAL_COMMUNITY): Payer: Self-pay | Admitting: Psychiatry

## 2013-03-04 ENCOUNTER — Ambulatory Visit (HOSPITAL_COMMUNITY): Payer: BC Managed Care – PPO | Admitting: Psychiatry

## 2013-03-20 ENCOUNTER — Ambulatory Visit (HOSPITAL_COMMUNITY): Payer: BC Managed Care – PPO | Admitting: Psychiatry

## 2013-04-01 ENCOUNTER — Telehealth (HOSPITAL_COMMUNITY): Payer: Self-pay

## 2013-04-01 DIAGNOSIS — F900 Attention-deficit hyperactivity disorder, predominantly inattentive type: Secondary | ICD-10-CM

## 2013-04-01 MED ORDER — METHYLPHENIDATE 30 MG/9HR TD PTCH: 1.0000 | MEDICATED_PATCH | Freq: Every day | TRANSDERMAL | Status: DC

## 2013-04-01 NOTE — Telephone Encounter (Signed)
Refill request for medications-daytrana 30 mg # 90. Will fill the same.

## 2013-04-03 ENCOUNTER — Other Ambulatory Visit: Payer: Self-pay

## 2013-05-02 ENCOUNTER — Encounter (HOSPITAL_COMMUNITY): Payer: Self-pay | Admitting: Psychiatry

## 2013-05-02 ENCOUNTER — Ambulatory Visit (INDEPENDENT_AMBULATORY_CARE_PROVIDER_SITE_OTHER): Payer: BC Managed Care – PPO | Admitting: Psychiatry

## 2013-05-02 VITALS — BP 91/76 | HR 65 | Ht 74.0 in | Wt 196.0 lb

## 2013-05-02 DIAGNOSIS — F840 Autistic disorder: Secondary | ICD-10-CM

## 2013-05-02 DIAGNOSIS — F988 Other specified behavioral and emotional disorders with onset usually occurring in childhood and adolescence: Secondary | ICD-10-CM

## 2013-05-02 DIAGNOSIS — F900 Attention-deficit hyperactivity disorder, predominantly inattentive type: Secondary | ICD-10-CM

## 2013-05-02 MED ORDER — METHYLPHENIDATE 30 MG/9HR TD PTCH: 1.0000 | MEDICATED_PATCH | Freq: Every day | TRANSDERMAL | Status: DC

## 2013-05-02 NOTE — Progress Notes (Signed)
[facility] Follow-up Outpatient Visit  Craig Weeks 08-Dec-1990  Date: 05/02/2013  History of Chief Complaint:   Chief Complaint  Patient presents with  . Follow-up   HPI Comments:      HPI Comments: Mr. Dygert is a 22  y/o male with a past psychiatric history significant for Asperger's Disorder, and Attention Deficit Hyperactivity, disorder Inattentive type. The patient is referred for psychiatric services for medication management.   . Location: Patient   . Quality: The patient reports that he has been doing well both at work and her social life. He states he will be going on a date and is excited.  He is interested in going to college and is considering his options. 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 The patient he is taking his medication and denies any side effects.   In the area of affective symptoms, patient appears euthymic. Patient denies current suicidal ideation, intent, or plan. Patient denies current homicidal ideation, intent, or plan. Patient denies auditory hallucinations. Patient denies visual hallucinations. Patient denies symptoms of paranoia. Patient states sleep is good, with approximately 6-7 ours of sleep per night. Appetite is decreased when medication is working. Energy level is good. Patient denies symptoms of anhedonia. Patient denies hopelessness, helplessness, or guilt.   . Severity: Depression: 9/10 (0=Very depressed; 5=Neutral; 10=Very Happy)  Anxiety- 4/10 (0=no anxiety; 5= moderate/tolerable anxiety; 10= panic attacks)  . Duration: Congenital  . Timing: No specific time  . Context: Social interactions.   . Modifying factors: Improves with having things to do.   . Associated signs and symptoms (e.g., loss of appetite, loss of weight, loss of sexual interest)  Denies any recent episodes consistent with mania, particularly decreased need  for sleep with increased energy, grandiosity, impulsivity, hyperverbal and pressured speech, or increased productivity.  Denies any recent symptoms consistent with psychosis, particularly auditory or visual hallucinations, thought broadcasting/insertion/withdrawal, or ideas of reference. He reports one episode of excessive worry to the point of physical symptoms and panic attacks.  Denies any history of trauma or symptoms consistent with PTSD such as flashbacks, nightmares, hypervigilance, feelings of numbness or inability to connect with others.    Review of Systems  Constitutional: Negative for fever, chills and fatigue or change of appetite.  Respiratory: Negative for cough, chest tightness and shortness of breath.   Cardiovascular: Negative for chest pain, palpitations and leg swelling.  Gastrointestinal: Negative for nausea, vomiting, abdominal pain, diarrhea and constipation.  Neurological: Negative for dizziness, light-headedness and headaches.   Filed Vitals:   05/02/13 1538  BP: 91/76  Pulse: 65  Height: 6\' 2"  (1.88 m)  Weight: 196 lb (88.905 kg)   Physical Exam  Vitals reviewed. Constitutional: He appears well-developed and well-nourished. No distress.  Skin: He is not diaphoretic.  Musculoskeletal: Strength & Muscle Tone: within normal limits Gait & Station: normal Patient leans: N/A  Traumatic Brain Injury: Negative   Past Psychiatric History:Reviewed Diagnosis: Asperger's Disorder-at age 3, ADHD  Hospitalizations: Patient denies.  Outpatient Care: Patient reports that his only psychiatrist was Dr. Tomasa Rand  Substance Abuse Care: Patient denies.  Self-Mutilation: Patient denies.  Suicidal Attempts: Patient denies.  Violent Behaviors: Age 45 fight.   Past Medical History:  Reviewed Past Medical History  Diagnosis Date  . Asperger's disorder 05/01/2012  . Perforated eardrum     Right side    History of  Loss of Consciousness:  Negative Seizure History:   Negative Cardiac History:  Negative  Allergies: Reviewed Allergies  Allergen Reactions  . Milk-Related Compounds     REACTION: gas,bloating, stomach hurts     Current Medications: Reviewed Current Outpatient Prescriptions on File Prior to Visit  Medication Sig Dispense Refill  . amoxicillin (AMOXIL) 250 MG/5ML suspension Take 10 mLs (500 mg total) by mouth 3 (three) times daily.  300 mL  0  . methylphenidate (DAYTRANA) 30 MG/9HR Place 1 patch onto the skin daily. wear patch for 9 hours only each day  90 patch  0   No current facility-administered medications on file prior to visit.    Previous Psychotropic Medications:Reviewed  Medication   Daytrana  Concerta   Substance Abuse History in the last 12 months: Caffeine: Patient denies. Tobacco: Patient denies. Alcohol: Patient denies. Illicit Drugs: Patient denies.   Medical Consequences of Substance Abuse:Patient denies.  Legal Consequences of Substance Abuse: Patient denies.  Family Consequences of Substance Abuse: Patient denies.  Blackouts:  Negative DT's:  Not Applicable Withdrawal Symptoms:  Not Applicable  Social History: Reviewed Current Place of Residence: [place], [place] Place of Birth: [place], [place] Family Members: Mother, father, 1 older brothers Marital Status:  Single Children: None Relationships: Patient reports he has no main source of emotional support Education:  HS Graduate Educational Problems/Performance: Did fair in school. 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 Legal History:Patient  Hobbies/Interests: Tabletop games such a IT trainer.   Family History:  Reviewed Family History  Problem Relation Age of Onset  . ADD / ADHD Father   . Prostate cancer Maternal Grandfather   . Bipolar disorder Maternal Grandmother   . Alcohol abuse Maternal Grandmother   . Dementia Paternal Grandfather    . Drug abuse Cousin     Psychiatric Specialty Examination: Objective:  Appearance: Casual  Eye Contact::  Poor  Speech:  Clear and Coherent and Normal Rate  Volume:  Normal  Mood:  "pretty good"  Depression: 9/10 (0=Very depressed; 5=Neutral; 10=Very Happy)  Anxiety- 4/10 (0=no anxiety; 5= moderate/tolerable anxiety; 10= panic attacks)   Affect:  Appropriate, Congruent and Full Range  Thought Process:  Coherent, Linear and Logical  Orientation:  Full (Time, Place, and Person)  Thought Content:  WDL  Suicidal Thoughts:  No  Homicidal Thoughts:  No  Judgement:  Fair  Insight:  Fair  Psychomotor Activity:  Increased  Akathisia:  Negative  Memory: 3/3 Immediate; 1/3 Recent.  Handed:  Right  AIMS (if indicated):  As noted in chart  Assets:  Financial Resources/Insurance Housing Physical Health Social Support Transportation Vocational/Educational    Laboratory/X-Ray Psychological Evaluation(s)  Reviewed  None   Assessment:   AXIS I Autism Spectrum Disorder, Attention Deficit Hyperactivity Disorder, inattentive type  AXIS II No diagnosis  AXIS III No Diagnosis   AXIS IV other psychosocial or environmental problems  AXIS V 51-60 moderate symptoms   Treatment Plan/Recommendations:  PLAN:   Plan of Care:  PLAN:  1. Affirm with the patient that the medications are taken as ordered. Patient  expressed understanding of how their medications were to be used.    Laboratory: No labs warranted at this time.    Psychotherapy: Therapy: brief supportive therapy provided.  Discussed psychosocial stressors in detail.  Will continue random UDS as long as patient is on Daytrana.  Medications:  Continue the following psychiatric medications as written prior to this appointment with the  following changes::  a) Continue Daytrana 40 mg daily. Provided 90 day supply.  Parents requested 90 day supply due to their insurance. B) Will monitor for mood symptoms and the need for any  additional medications.  -Risks and benefits, side effects and alternatives discussed with patient, he was given an opportunity to ask questions about his medication, illness, and treatment. All current psychiatric medications have been reviewed and discussed with the patient and adjusted as clinically appropriate. The patient has been provided an accurate and updated list of the medications being now prescribed.   Routine PRN Medications:  Negative  Consultations: The patient was encouraged to keep all PCP and specialty clinic appointments.   Safety Concerns:   Patient told to call clinic if any problems occur. Patient advised to go to  ER  if he should develop SI/HI, side effects, or if symptoms worsen. Has crisis numbers to call if needed.    Other:   8. Patient was instructed to return to clinic in 3 months.  9. The patient was advised to call and cancel their mental health appointment within 24 hours of the appointment, if they are unable to keep the appointment, as well as the three no show and termination from clinic policy. 10. The patient expressed understanding of the plan and agrees with the above.   Jacqulyn Cane, M.D.  05/02/2013 3:35 PM

## 2013-07-31 ENCOUNTER — Ambulatory Visit (HOSPITAL_COMMUNITY): Payer: Self-pay | Admitting: Psychiatry

## 2013-08-01 ENCOUNTER — Encounter (HOSPITAL_COMMUNITY): Payer: Self-pay | Admitting: Psychiatry

## 2013-08-01 ENCOUNTER — Ambulatory Visit (INDEPENDENT_AMBULATORY_CARE_PROVIDER_SITE_OTHER): Payer: BC Managed Care – PPO | Admitting: Psychiatry

## 2013-08-01 VITALS — BP 129/65 | HR 67 | Wt 198.0 lb

## 2013-08-01 DIAGNOSIS — F840 Autistic disorder: Secondary | ICD-10-CM

## 2013-08-01 DIAGNOSIS — F900 Attention-deficit hyperactivity disorder, predominantly inattentive type: Secondary | ICD-10-CM

## 2013-08-01 DIAGNOSIS — F988 Other specified behavioral and emotional disorders with onset usually occurring in childhood and adolescence: Secondary | ICD-10-CM

## 2013-08-01 MED ORDER — METHYLPHENIDATE 30 MG/9HR TD PTCH: 1.0000 | MEDICATED_PATCH | Freq: Every day | TRANSDERMAL | Status: DC

## 2013-08-01 NOTE — Progress Notes (Signed)
[facility] Follow-up Outpatient Visit  Craig Weeks 05/03/1991  Date: 08/01/2013  History of Chief Complaint:   Chief Complaint  Patient presents with  . Follow-up    HPI Comments: Craig Weeks is a 23  y/o male with a past psychiatric history significant for Asperger's Disorder, and Attention Deficit Hyperactivity, disorder Inattentive type. The patient is referred for psychiatric services for medication management.   . Location: The patient reports he is doing well.    . Quality: The patient reports that he has been doing well both at work and her social life. He reports that he had a girlfriend for 2.5 months but ended the relationship mutually. He is still interested in going to college and is considering Lobbyist. Work is better. 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 The patient he is taking his medication and denies any side effects.   In the area of affective symptoms, patient appears euthymic. Patient denies current suicidal ideation, intent, or plan. Patient denies current homicidal ideation, intent, or plan. Patient denies auditory hallucinations. Patient denies visual hallucinations. Patient denies symptoms of paranoia. Patient states sleep is good, with approximately 6-7 ours of sleep per night. Appetite is still decreased with his patch on. Energy level is good. Patient denies symptoms of anhedonia. Patient denies hopelessness, helplessness, or guilt.   . Severity: Depression: 9/10 (0=Very depressed; 5=Neutral; 10=Very Happy)  Anxiety- 4/10 (0=no anxiety; 5= moderate/tolerable anxiety; 10= panic attacks)  . Duration: Congenital  . Timing: No specific time  . Context: Social interactions.   . Modifying factors: Improves with having things to do.   . Associated signs and symptoms (e.g., loss of appetite, loss of weight, loss of sexual interest)  Denies any recent episodes consistent with mania, particularly decreased need for  sleep with increased energy, grandiosity, impulsivity, hyperverbal and pressured speech, or increased productivity.  Denies any recent symptoms consistent with psychosis, particularly auditory or visual hallucinations, thought broadcasting/insertion/withdrawal, or ideas of reference. He reports one episode of excessive worry to the point of physical symptoms and panic attacks.  Denies any history of trauma or symptoms consistent with PTSD such as flashbacks, nightmares, hypervigilance, feelings of numbness or inability to connect with others.    Review of Systems  Constitutional: Negative for fever, chills and weight loss.  Respiratory: Negative for cough, hemoptysis, sputum production and shortness of breath.   Cardiovascular: Negative for chest pain and palpitations.  Neurological: Negative for dizziness, tremors, sensory change and loss of consciousness.  Psychiatric/Behavioral: Negative for depression, suicidal ideas, hallucinations, memory loss and substance abuse. The patient is not nervous/anxious and does not have insomnia.      Filed Vitals:   08/01/13 1524  BP: 129/65  Pulse: 67  Weight: 198 lb (89.812 kg)   Physical Exam  Vitals reviewed. Constitutional: He appears well-developed and well-nourished. No distress.  Skin: He is not diaphoretic.  Musculoskeletal: Strength & Muscle Tone: within normal limits Gait & Station: normal Patient leans: N/A  Traumatic Brain Injury: Negative   Past Psychiatric History:Reviewed Diagnosis: Asperger's Disorder-at age 79, ADHD  Hospitalizations: Patient denies.  Outpatient Care: Patient reports that his only psychiatrist was Craig Weeks  Substance Abuse Care: Patient denies.  Self-Mutilation: Patient denies.  Suicidal Attempts: Patient denies.  Violent Behaviors: Age 27 fight.   Past Medical History:  Reviewed Past Medical History  Diagnosis Date  . Asperger's disorder 05/01/2012  . Perforated eardrum     Right side     History of Loss  of Consciousness:  Negative Seizure History:  Negative Cardiac History:  Negative  Allergies: Reviewed Allergies  Allergen Reactions  . Milk-Related Compounds     REACTION: gas,bloating, stomach hurts     Current Medications: Reviewed Current Outpatient Prescriptions on File Prior to Visit  Medication Sig Dispense Refill  . methylphenidate (DAYTRANA) 30 MG/9HR Place 1 patch onto the skin daily. wear patch for 9 hours only each day  90 patch  0   No current facility-administered medications on file prior to visit.    Previous Psychotropic Medications:Reviewed  Medication   Daytrana  Concerta   Substance Abuse History in the last 12 months: Caffeine: Patient denies. Tobacco: Patient denies. Alcohol: Patient denies. Illicit Drugs: Patient denies.   Medical Consequences of Substance Abuse:Patient denies.  Legal Consequences of Substance Abuse: Patient denies.  Family Consequences of Substance Abuse: Patient denies.  Blackouts:  Negative DT's:  Not Applicable Withdrawal Symptoms:  Not Applicable  Social History: Reviewed [place] Place [place] Birth[place] Family Members: Mother, father, 1 older brothers Marital Status:  Single Children: None Relationships: Patient reports he has no main source of emotional support Education:  HS Graduate Educational Problems/Performance: Did fair in school. 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 Legal History:Patient  Hobbies/Interests: Tabletop games such a IT trainerDungeons and Dragons.   Family History:  Reviewed Family History  Problem Relation Age of Onset  . ADD / ADHD Father   . Prostate cancer Maternal Grandfather   . Bipolar disorder Maternal Grandmother   . Alcohol abuse Maternal Grandmother   . Dementia Paternal Grandfather   . Drug abuse Cousin     Psychiatric Specialty  Examination: Objective:  Appearance: Casual  Eye Contact::  Poor  Speech:  Clear and Coherent and Normal Rate  Volume:  Normal  Mood:  "today pretty good"    Affect:  Appropriate, Congruent and Full Range  Thought Process:  Coherent, Linear and Logical  Orientation:  Full (Time, Place, and Person)  Thought Content:  WDL  Suicidal Thoughts:  No  Homicidal Thoughts:  No  Judgement:  Fair  Insight:  Fair  Psychomotor Activity:  Increased  Akathisia:  Negative  Memory: 3/3 Immediate; 1/3 Recent.  Handed:  Right  Language-intact  Fund of knowledge-average  AIMS (if indicated):  As noted in chart  Assets:  Financial Resources/Insurance Housing Physical Health Social Support Transportation Vocational/Educational    Laboratory/X-Ray Psychological Evaluation(s)  Reviewed  None   Assessment:   AXIS I Autism Spectrum Disorder-stable , Attention Deficit Hyperactivity Disorder, inattentive type-stable  AXIS II No diagnosis  AXIS III No Diagnosis   AXIS IV other psychosocial or environmental problems  AXIS V 61 Mild symptoms   Treatment Plan/Recommendations:  PLAN:   Plan of Care:  PLAN:  1. Affirm with the patient that the medications are taken as ordered. Patient  expressed understanding of how their medications were to be used.    Laboratory: No labs warranted at this time. Reviewed last UDS. Will continue random UDS as long as patient is on Daytrana.   Psychotherapy: Therapy: brief supportive therapy provided.  Discussed psychosocial stressors in detail.  More than 50% of the visit was spent on individual therapy/counseling.   Medications:  Continue the following psychiatric medications as written prior to this appointment with the following changes: a) Continue Daytrana 40 mg daily. Provided 90 day supply.  Parents requested 90 day supply due to their insurance.  B) Will monitor for mood symptoms and the need for any additional medications.  -Risks and benefits, side  effects and alternatives discussed with patient, he was given an opportunity to ask questions about his medication, illness, and treatment. All current psychiatric medications have been reviewed and discussed with the patient and adjusted as clinically appropriate. The patient has been provided an accurate and updated list of the medications being now prescribed.   Routine PRN Medications:  Negative  Consultations: The patient was encouraged to keep all PCP and specialty clinic appointments.   Safety Concerns:   Patient told to call clinic if any problems occur. Patient advised to go to  ER  if he should develop SI/HI, side effects, or if symptoms worsen. Has crisis numbers to call if needed.    Other:   8. Patient was instructed to return to clinic in 3 months.  9. The patient was advised to call and cancel their mental health appointment within 24 hours of the appointment, if they are unable to keep the appointment, as well as the three no show and termination from clinic policy. 10. The patient expressed understanding of the plan and agrees with the above. 11. Patient informed that April 15th, 2015 would be my last day at this clinic.   Time Spent: 30 minutes Jacqulyn Cane, M.D.  08/01/2013 3:35 PM

## 2013-08-13 ENCOUNTER — Ambulatory Visit (INDEPENDENT_AMBULATORY_CARE_PROVIDER_SITE_OTHER): Payer: BC Managed Care – PPO | Admitting: Internal Medicine

## 2013-08-13 ENCOUNTER — Encounter: Payer: Self-pay | Admitting: Internal Medicine

## 2013-08-13 VITALS — BP 100/68 | HR 61 | Temp 97.0°F | Wt 197.8 lb

## 2013-08-13 DIAGNOSIS — B349 Viral infection, unspecified: Secondary | ICD-10-CM

## 2013-08-13 DIAGNOSIS — B9789 Other viral agents as the cause of diseases classified elsewhere: Secondary | ICD-10-CM

## 2013-08-13 MED ORDER — OSELTAMIVIR PHOSPHATE 75 MG PO CAPS: 75.0000 mg | ORAL_CAPSULE | Freq: Two times a day (BID) | ORAL | Status: DC

## 2013-08-13 NOTE — Progress Notes (Signed)
Pre visit review using our clinic review tool, if applicable. No additional management support is needed unless otherwise documented below in the visit note. 

## 2013-08-13 NOTE — Progress Notes (Signed)
   Subjective:    Patient ID: Craig Weeks, male    DOB: 03/14/1991, 23 y.o.   MRN: [id number]  HPI  Here with 2-3 days acute onset fever, general weakness and malaise, diffuse myalgias, head congestion, mild nonprod cough, n/v, crampy abd pains and several episodes watery stools at the beginning now improved. Pt denies chest pain, increased sob or doe, wheezing, orthopnea, PND, increased LE swelling, palpitations, dizziness or syncope. Pt denies new neurological symptoms such as new headache, or facial or extremity weakness or numbness   Pt denies polydipsia, polyuria, Past Medical History  Diagnosis Date  . Asperger's disorder 05/01/2012  . Perforated eardrum     Right side   Past Surgical History  Procedure Laterality Date  . Ganglion cyst excision      right  . Circumcision      Age 1    reports that he has never smoked. He has never used smokeless tobacco. He reports that he does not drink alcohol or use illicit drugs. family history includes ADD / ADHD in his father; Alcohol abuse in his maternal grandmother; Bipolar disorder in his maternal grandmother; Dementia in his paternal grandfather; Drug abuse in his cousin; Prostate cancer in his maternal grandfather. Allergies  Allergen Reactions  . Milk-Related Compounds     REACTION: gas,bloating, stomach hurts   Current Outpatient Prescriptions on File Prior to Visit  Medication Sig Dispense Refill  . methylphenidate (DAYTRANA) 30 MG/9HR Place 1 patch onto the skin daily. wear patch for 9 hours only each day  90 patch  0   No current facility-administered medications on file prior to visit.     Review of Systems All otherwise neg per pt     Objective:   Physical Exam BP 100/68  Pulse 61  Temp(Src) 97 F (36.1 C) (Oral)  Wt 197 lb 12 oz (89.699 kg)  SpO2 96% VS noted, mild ill appearing Constitutional: Pt appears well-developed and well-nourished.  HENT: Head: NCAT.  Right Ear: External ear normal.  Left Ear:  External ear normal.  Bilat tm's with mild erythema.  Max sinus areas non tender.  Pharynx with mild erythema, no exudate Eyes: Conjunctivae and EOM are normal. Pupils are equal, round, and reactive to light.  Neck: Normal range of motion. Neck supple. few bilat submandib LA noted Cardiovascular: Normal rate and regular rhythm.   Pulmonary/Chest: Effort normal and breath sounds normal - no rales or wheezing.  Abd:  Soft, NT, non-distended, + BS Neurological: Pt is alert. Not confused  Skin: Skin is warm. No erythema.  Psychiatric: Pt behavior is normal. Thought content normal.     Assessment & Plan:

## 2013-08-13 NOTE — Assessment & Plan Note (Addendum)
Hx and exam c/w viral illness, presumed influenza, for tamiflu course, declines lab for r/o mono or cbc  ADDendum: mar 19 - pt is in lab now, changed his mind, asks for tests - will order

## 2013-08-13 NOTE — Patient Instructions (Signed)
Please take all new medication as prescribed Please continue all other medications as before, and refills have been done if requested. Please have the pharmacy call with any other refills you may need.  Please call if you change your mind about having lab work done, such as a test for mononucleosis  You can also take Delsym OTC for cough, and/or Mucinex (or it's generic off brand) for congestion, and tylenol as needed for pain.

## 2013-08-14 ENCOUNTER — Telehealth: Payer: Self-pay

## 2013-08-14 ENCOUNTER — Other Ambulatory Visit: Payer: BC Managed Care – PPO

## 2013-08-14 NOTE — Addendum Note (Signed)
Addended by: Corwin LevinsJOHN, Javien W on: 08/14/2013 12:09 PM   Modules accepted: Orders

## 2013-08-14 NOTE — Telephone Encounter (Signed)
Order now done

## 2013-08-14 NOTE — Telephone Encounter (Signed)
Patient in lab and no order.

## 2013-08-15 ENCOUNTER — Other Ambulatory Visit (INDEPENDENT_AMBULATORY_CARE_PROVIDER_SITE_OTHER): Payer: BC Managed Care – PPO

## 2013-08-15 DIAGNOSIS — B349 Viral infection, unspecified: Secondary | ICD-10-CM

## 2013-08-15 DIAGNOSIS — B9789 Other viral agents as the cause of diseases classified elsewhere: Secondary | ICD-10-CM

## 2013-08-15 LAB — CBC WITH DIFFERENTIAL/PLATELET
BASOS PCT: 0.3 % (ref 0.0–3.0)
Basophils Absolute: 0 10*3/uL (ref 0.0–0.1)
EOS PCT: 1.6 % (ref 0.0–5.0)
Eosinophils Absolute: 0.1 10*3/uL (ref 0.0–0.7)
HCT: 43.8 % (ref 39.0–52.0)
Hemoglobin: 14.7 g/dL (ref 13.0–17.0)
LYMPHS PCT: 38.2 % (ref 12.0–46.0)
Lymphs Abs: 2.1 10*3/uL (ref 0.7–4.0)
MCHC: 33.7 g/dL (ref 30.0–36.0)
MCV: 92.2 fl (ref 78.0–100.0)
MONO ABS: 0.4 10*3/uL (ref 0.1–1.0)
MONOS PCT: 6.7 % (ref 3.0–12.0)
NEUTROS PCT: 53.2 % (ref 43.0–77.0)
Neutro Abs: 3 10*3/uL (ref 1.4–7.7)
PLATELETS: 322 10*3/uL (ref 150.0–400.0)
RBC: 4.75 Mil/uL (ref 4.22–5.81)
RDW: 13.5 % (ref 11.5–14.6)
WBC: 5.6 10*3/uL (ref 4.5–10.5)

## 2013-08-15 LAB — MONONUCLEOSIS SCREEN: Mono Screen: NEGATIVE

## 2013-12-15 ENCOUNTER — Telehealth: Payer: Self-pay | Admitting: Internal Medicine

## 2013-12-15 NOTE — Telephone Encounter (Signed)
Patient is switching practices but will be out of daytrana for 8 days between switch.  Patient is requesting a script for enough to get him through.  Please advise.

## 2013-12-15 NOTE — Telephone Encounter (Signed)
Pt should contact the prescriber of this medicine, as I have not been the previous prescriber

## 2013-12-16 NOTE — Telephone Encounter (Signed)
Patients mother informed of denial and instructions.

## 2014-06-23 ENCOUNTER — Encounter: Payer: Self-pay | Admitting: *Deleted

## 2014-06-23 ENCOUNTER — Ambulatory Visit (INDEPENDENT_AMBULATORY_CARE_PROVIDER_SITE_OTHER): Payer: BLUE CROSS/BLUE SHIELD | Admitting: *Deleted

## 2014-06-23 DIAGNOSIS — Z23 Encounter for immunization: Secondary | ICD-10-CM

## 2014-07-22 ENCOUNTER — Other Ambulatory Visit (INDEPENDENT_AMBULATORY_CARE_PROVIDER_SITE_OTHER): Payer: BLUE CROSS/BLUE SHIELD

## 2014-07-22 ENCOUNTER — Encounter: Payer: Self-pay | Admitting: Internal Medicine

## 2014-07-22 ENCOUNTER — Ambulatory Visit (INDEPENDENT_AMBULATORY_CARE_PROVIDER_SITE_OTHER): Payer: BLUE CROSS/BLUE SHIELD | Admitting: Internal Medicine

## 2014-07-22 VITALS — BP 132/80 | HR 71 | Temp 98.4°F | Resp 16 | Ht 74.0 in | Wt 199.4 lb

## 2014-07-22 DIAGNOSIS — R1031 Right lower quadrant pain: Secondary | ICD-10-CM

## 2014-07-22 DIAGNOSIS — R112 Nausea with vomiting, unspecified: Secondary | ICD-10-CM

## 2014-07-22 DIAGNOSIS — F840 Autistic disorder: Secondary | ICD-10-CM

## 2014-07-22 LAB — URINALYSIS
Bilirubin Urine: NEGATIVE
Hgb urine dipstick: NEGATIVE
KETONES UR: NEGATIVE
Leukocytes, UA: NEGATIVE
Nitrite: NEGATIVE
PH: 7.5 (ref 5.0–8.0)
Specific Gravity, Urine: 1.015 (ref 1.000–1.030)
URINE GLUCOSE: NEGATIVE
Urobilinogen, UA: 0.2 (ref 0.0–1.0)

## 2014-07-22 LAB — CBC WITH DIFFERENTIAL/PLATELET
BASOS PCT: 0.1 % (ref 0.0–3.0)
Basophils Absolute: 0 10*3/uL (ref 0.0–0.1)
EOS ABS: 0 10*3/uL (ref 0.0–0.7)
Eosinophils Relative: 0.2 % (ref 0.0–5.0)
HCT: 46.1 % (ref 39.0–52.0)
Hemoglobin: 16 g/dL (ref 13.0–17.0)
Lymphocytes Relative: 24.1 % (ref 12.0–46.0)
Lymphs Abs: 1.6 10*3/uL (ref 0.7–4.0)
MCHC: 34.8 g/dL (ref 30.0–36.0)
MCV: 90.8 fl (ref 78.0–100.0)
MONO ABS: 0.3 10*3/uL (ref 0.1–1.0)
Monocytes Relative: 4.1 % (ref 3.0–12.0)
NEUTROS PCT: 71.5 % (ref 43.0–77.0)
Neutro Abs: 4.8 10*3/uL (ref 1.4–7.7)
PLATELETS: 330 10*3/uL (ref 150.0–400.0)
RBC: 5.08 Mil/uL (ref 4.22–5.81)
RDW: 13.8 % (ref 11.5–15.5)
WBC: 6.7 10*3/uL (ref 4.0–10.5)

## 2014-07-22 LAB — BASIC METABOLIC PANEL
BUN: 11 mg/dL (ref 6–23)
CHLORIDE: 105 meq/L (ref 96–112)
CO2: 29 meq/L (ref 19–32)
Calcium: 9.9 mg/dL (ref 8.4–10.5)
Creatinine, Ser: 0.81 mg/dL (ref 0.40–1.50)
GFR: 123.86 mL/min (ref >60.00)
Glucose, Bld: 103 mg/dL — ABNORMAL HIGH (ref 70–99)
POTASSIUM: 3.9 meq/L (ref 3.5–5.1)
Sodium: 140 mEq/L (ref 135–145)

## 2014-07-22 LAB — HEPATIC FUNCTION PANEL
ALT: 20 U/L (ref 0–53)
AST: 17 U/L (ref 0–37)
Albumin: 5.1 g/dL (ref 3.5–5.2)
Alkaline Phosphatase: 86 U/L (ref 39–117)
Bilirubin, Direct: 0.1 mg/dL (ref 0.0–0.3)
TOTAL PROTEIN: 7.8 g/dL (ref 6.0–8.3)
Total Bilirubin: 0.6 mg/dL (ref 0.2–1.2)

## 2014-07-22 LAB — LIPASE: Lipase: 15 U/L (ref 11.0–59.0)

## 2014-07-22 LAB — AMYLASE: AMYLASE: 35 U/L (ref 27–131)

## 2014-07-22 MED ORDER — TRAMADOL HCL 50 MG PO TABS: 50.0000 mg | ORAL_TABLET | Freq: Three times a day (TID) | ORAL | Status: DC | PRN

## 2014-07-22 NOTE — Patient Instructions (Signed)
  Your next office appointment will be determined based upon review of your pending labs   Those instructions will be transmitted to you through My Chart   Critical values will be called.  Followup as needed for any active or acute issue. Please report any significant change in your symptoms. 

## 2014-07-22 NOTE — Progress Notes (Signed)
   Subjective:    Patient ID: Craig Weeks, male    DOB: 09/07/1990, 24 y.o.   MRN: [id number]  HPI  He describes intermittent sharp pain up to level IX for the last month between the umbilicus and the right lower quadrant. Today it's a 6.  He states that it is improved when supine or standing. Sitting or riding on a bumpy road makes it worse.  He's had nausea for the last month. Today he had nausea vomiting 3. He questioned  some specks of blood in the vomitus.  He questioned constipation as a factor & took mineral oil. The bowel movement was simply soft.  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  He denies other GI, GU, constitutionsal symptoms.  Review of Systems Unexplained weight loss,  significant dyspepsia, dysphagia, melena, rectal bleeding, or persistently small caliber stools are denied.  Dysuria, pyuria, hematuria, frequency, nocturia or polyuria are denied.    Objective:   Physical Exam  Pertinent or positive findings include: Affect is somewhat flat. He exhibits some tenderness to palpation in the left lower quadrant, epigastric area, and in the right lower quadrant localized halfway between the umbilicus and the hip. He has some R flank discomfort to percussion. He describes exquisite tenderness with palpation of the gonads. No epididymal abnormalities were noted. No hernia was present. He was slightly tender to rectal exam without significant adnexal tenderness.  General appearance :adequately nourished; in no distress. Eyes: No conjunctival inflammation or scleral icterus is present. Oral exam: Dental hygiene is good. Lips and gums are healthy appearing.There is no oropharyngeal erythema or exudate noted.  Heart:  Normal rate and regular rhythm. S1 and S2 normal without gallop, murmur, click, rub or other extra sounds   Lungs:Chest clear to auscultation; no wheezes, rhonchi,rales ,or rubs present.No increased work of breathing.  Abdomen: bowel sounds  slightly decreased, soft without masses, organomegaly or hernias noted.  No guarding or rebound.  Vascular : all pulses equal ; no bruits present. Skin:Warm & dry.  Intact without suspicious lesions or rashes ; no jaundice or tenting Lymphatic: No lymphadenopathy is noted about the head, neck, axilla, or inguinal areas.  Neuro: Strength, tone  normal. Genitourinary: Genitalia normal . Rectal tone is normal. The prostate is normal in size; there is no induration or nodule present.         Assessment & Plan:  #1 right lower quadrant pain  #2 nausea and vomiting  Plan: See orders and recommendations

## 2014-07-22 NOTE — Progress Notes (Signed)
Pre visit review using our clinic review tool, if applicable. No additional management support is needed unless otherwise documented below in the visit note. 

## 2014-07-23 ENCOUNTER — Telehealth: Payer: Self-pay

## 2014-07-23 ENCOUNTER — Other Ambulatory Visit (INDEPENDENT_AMBULATORY_CARE_PROVIDER_SITE_OTHER): Payer: BLUE CROSS/BLUE SHIELD

## 2014-07-23 DIAGNOSIS — R7309 Other abnormal glucose: Secondary | ICD-10-CM

## 2014-07-23 LAB — HEMOGLOBIN A1C: Hgb A1c MFr Bld: 5.4 % (ref 4.6–6.5)

## 2014-07-23 NOTE — Telephone Encounter (Signed)
Add on request has been faxed to lab 

## 2014-07-23 NOTE — Telephone Encounter (Signed)
-----   Message from Pecola LawlessWilliam F Hopper, MD sent at 07/22/2014  5:31 PM EST ----- Please add A1c (R73.9)

## 2014-07-24 ENCOUNTER — Emergency Department (HOSPITAL_COMMUNITY)
Admission: EM | Admit: 2014-07-24 | Discharge: 2014-07-24 | Disposition: A | Discharge status: Home / Self Care | Payer: BLUE CROSS/BLUE SHIELD | Attending: Emergency Medicine | Admitting: Emergency Medicine

## 2014-07-24 ENCOUNTER — Encounter (HOSPITAL_COMMUNITY): Payer: Self-pay | Admitting: Emergency Medicine

## 2014-07-24 ENCOUNTER — Emergency Department (HOSPITAL_COMMUNITY): Payer: BLUE CROSS/BLUE SHIELD

## 2014-07-24 DIAGNOSIS — R52 Pain, unspecified: Secondary | ICD-10-CM

## 2014-07-24 DIAGNOSIS — R1110 Vomiting, unspecified: Secondary | ICD-10-CM | POA: Diagnosis not present

## 2014-07-24 DIAGNOSIS — R1084 Generalized abdominal pain: Secondary | ICD-10-CM | POA: Insufficient documentation

## 2014-07-24 DIAGNOSIS — Z79899 Other long term (current) drug therapy: Secondary | ICD-10-CM | POA: Diagnosis not present

## 2014-07-24 DIAGNOSIS — Z8659 Personal history of other mental and behavioral disorders: Secondary | ICD-10-CM | POA: Diagnosis not present

## 2014-07-24 DIAGNOSIS — Z8669 Personal history of other diseases of the nervous system and sense organs: Secondary | ICD-10-CM | POA: Diagnosis not present

## 2014-07-24 LAB — URINALYSIS, ROUTINE W REFLEX MICROSCOPIC
Bilirubin Urine: NEGATIVE
GLUCOSE, UA: NEGATIVE mg/dL
Hgb urine dipstick: NEGATIVE
KETONES UR: NEGATIVE mg/dL
LEUKOCYTES UA: NEGATIVE
NITRITE: NEGATIVE
PH: 6.5 (ref 5.0–8.0)
Protein, ur: NEGATIVE mg/dL
SPECIFIC GRAVITY, URINE: 1.024 (ref 1.005–1.030)
UROBILINOGEN UA: 0.2 mg/dL (ref 0.0–1.0)

## 2014-07-24 LAB — COMPREHENSIVE METABOLIC PANEL
ALK PHOS: 82 U/L (ref 39–117)
ALT: 22 U/L (ref 0–53)
AST: 18 U/L (ref 0–37)
Albumin: 5.2 g/dL (ref 3.5–5.2)
Anion gap: 8 (ref 5–15)
BILIRUBIN TOTAL: 0.7 mg/dL (ref 0.3–1.2)
BUN: 13 mg/dL (ref 6–23)
CHLORIDE: 104 mmol/L (ref 96–112)
CO2: 29 mmol/L (ref 19–32)
Calcium: 9.5 mg/dL (ref 8.4–10.5)
Creatinine, Ser: 0.86 mg/dL (ref 0.50–1.35)
GFR calc Af Amer: >90 mL/min (ref >90)
GFR calc non Af Amer: >90 mL/min (ref >90)
Glucose, Bld: 118 mg/dL — ABNORMAL HIGH (ref 70–99)
POTASSIUM: 3.9 mmol/L (ref 3.5–5.1)
SODIUM: 141 mmol/L (ref 135–145)
Total Protein: 8.2 g/dL (ref 6.0–8.3)

## 2014-07-24 LAB — CBC WITH DIFFERENTIAL/PLATELET
BASOS ABS: 0 10*3/uL (ref 0.0–0.1)
Basophils Relative: 0 % (ref 0–1)
Eosinophils Absolute: 0 10*3/uL (ref 0.0–0.7)
Eosinophils Relative: 1 % (ref 0–5)
HCT: 47 % (ref 39.0–52.0)
HEMOGLOBIN: 16 g/dL (ref 13.0–17.0)
LYMPHS PCT: 18 % (ref 12–46)
Lymphs Abs: 1.6 10*3/uL (ref 0.7–4.0)
MCH: 31.3 pg (ref 26.0–34.0)
MCHC: 34 g/dL (ref 30.0–36.0)
MCV: 92 fL (ref 78.0–100.0)
MONOS PCT: 6 % (ref 3–12)
Monocytes Absolute: 0.5 10*3/uL (ref 0.1–1.0)
NEUTROS ABS: 6.8 10*3/uL (ref 1.7–7.7)
Neutrophils Relative %: 76 % (ref 43–77)
Platelets: 299 10*3/uL (ref 150–400)
RBC: 5.11 MIL/uL (ref 4.22–5.81)
RDW: 13.4 % (ref 11.5–15.5)
WBC: 8.9 10*3/uL (ref 4.0–10.5)

## 2014-07-24 LAB — LIPASE, BLOOD: Lipase: 19 U/L (ref 11–59)

## 2014-07-24 MED ORDER — HYDROMORPHONE HCL 1 MG/ML IJ SOLN
0.5000 mg | Freq: Once | INTRAMUSCULAR | Status: AC
  Administered 2014-07-24: 0.5 mg via INTRAVENOUS
  Filled 2014-07-24: qty 1

## 2014-07-24 MED ORDER — ONDANSETRON HCL 4 MG/2ML IJ SOLN
4.0000 mg | Freq: Once | INTRAMUSCULAR | Status: AC
  Administered 2014-07-24: 4 mg via INTRAVENOUS
  Filled 2014-07-24: qty 2

## 2014-07-24 MED ORDER — ONDANSETRON 4 MG PO TBDP: ORAL_TABLET | ORAL | Status: DC

## 2014-07-24 MED ORDER — DICYCLOMINE HCL 20 MG PO TABS: ORAL_TABLET | ORAL | Status: DC

## 2014-07-24 MED ORDER — SODIUM CHLORIDE 0.9 % IV BOLUS (SEPSIS)
1000.0000 mL | Freq: Once | INTRAVENOUS | Status: AC
  Administered 2014-07-24: 1000 mL via INTRAVENOUS

## 2014-07-24 MED ORDER — PANTOPRAZOLE SODIUM 40 MG IV SOLR
40.0000 mg | Freq: Once | INTRAVENOUS | Status: AC
  Administered 2014-07-24: 40 mg via INTRAVENOUS
  Filled 2014-07-24: qty 40

## 2014-07-24 NOTE — ED Notes (Signed)
Pt states he has been sick x 5 days  Pt states he has abd pain and vomiting  Pt states tonight he is having pain throughout his entire torso  Pt states he has been belching and having dry heaves  Pt states he has noticed small drops of blood in his emesis   Pt has dry heaves in triage  Mother states he has been sick off and on for about a month and they have been adjusting his diet at home  States she took him to his dr on Wednesday and they did lab work but it came back normal

## 2014-07-24 NOTE — Discharge Instructions (Signed)
Follow up with your md next week if not improving °

## 2014-07-24 NOTE — ED Notes (Signed)
Pt transported to DG at present time. 

## 2014-07-24 NOTE — ED Notes (Signed)
Pt reports nausea and pain still low. Will alert staff if pain increases.

## 2014-07-24 NOTE — ED Provider Notes (Signed)
CSN: [id number]     Arrival date & time 07/24/14  0602 History   First MD Initiated Contact with Patient 07/24/14 [phone]     Chief Complaint  Patient presents with  . Abdominal Pain     (Consider location/radiation/quality/duration/timing/severity/associated sxs/prior Treatment) Patient is a 24 y.o. male presenting with abdominal pain. The history is provided by the patient (the pt complains of some abd pain and vomiting).  Abdominal Pain Pain location:  Generalized Pain quality: aching   Pain radiates to:  Does not radiate Pain severity:  Mild Onset quality:  Gradual Timing:  Intermittent Progression:  Waxing and waning Chronicity:  New Associated symptoms: vomiting   Associated symptoms: no chest pain, no cough, no diarrhea, no fatigue and no hematuria     Past Medical History  Diagnosis Date  . Asperger's disorder 05/01/2012  . Perforated eardrum     Right side   Past Surgical History  Procedure Laterality Date  . Ganglion cyst excision      right  . Circumcision      Age 24   Family History  Problem Relation Age of Onset  . ADD / ADHD Father   . Crohn's disease Father   . Kidney Stones Father   . Gallstones Father   . Prostate cancer Maternal Grandfather   . Bipolar disorder Maternal Grandmother   . Alcohol abuse Maternal Grandmother   . Dementia Paternal Grandfather   . Drug abuse Cousin    History  Substance Use Topics  . Smoking status: Never Smoker   . Smokeless tobacco: Never Used  . Alcohol Use: No     Comment: occasional social    Review of Systems  Constitutional: Negative for appetite change and fatigue.  HENT: Negative for congestion, ear discharge and sinus pressure.   Eyes: Negative for discharge.  Respiratory: Negative for cough.   Cardiovascular: Negative for chest pain.  Gastrointestinal: Positive for vomiting and abdominal pain. Negative for diarrhea.  Genitourinary: Negative for frequency and hematuria.  Musculoskeletal: Negative for  back pain.  Skin: Negative for rash.  Neurological: Negative for seizures and headaches.  Psychiatric/Behavioral: Negative for hallucinations.      Allergies  Eggs or egg-derived products; Milk-related compounds; and Yeast-related products  Home Medications   Prior to Admission medications   Medication Sig Start Date End Date Taking? Authorizing Provider  cyclobenzaprine (FLEXERIL) 10 MG tablet Take 10 mg by mouth 2 (two) times daily as needed. Muscle spasms 04/03/14 04/03/15 Yes Historical Provider, MD  methylphenidate (METADATE CD) 30 MG CR capsule Take 30 mg by mouth every morning.   Yes Historical Provider, MD  dicyclomine (BENTYL) 20 MG tablet Take one every 6-8 hours as needed for abd cramps 07/24/14   Benny LennertJoseph L Edelmira Gallogly, MD  methylphenidate [facility](DAYTRANA) 30 MG/9HR Place 1 patch onto the skin daily. wear patch for 9 hours only each day Patient not taking: Reported on 07/24/2014 08/01/13   Larena SoxShaji J Puthuvel, MD  ondansetron (ZOFRAN ODT) 4 MG disintegrating tablet 4mg  ODT q4 hours prn nausea/vomit 07/24/14   Benny LennertJoseph L Nazareth Norenberg, MD  traMADol (ULTRAM) 50 MG tablet Take 1 tablet (50 mg total) by mouth every 8 (eight) hours as needed. Patient not taking: Reported on 07/24/2014 07/22/14   Pecola LawlessWilliam F Hopper, MD   BP 132/81 mmHg  Pulse 71  Temp(Src) 98.3 F (36.8 C) (Oral)  Resp 20  SpO2 98% Physical Exam  Constitutional: He is oriented to person, place, and time. He appears well-developed.  HENT:  Head: Normocephalic.  Eyes: Conjunctivae and EOM are normal. No scleral icterus.  Neck: Neck supple. No thyromegaly present.  Cardiovascular: Normal rate and regular rhythm.  Exam reveals no gallop and no friction rub.   No murmur heard. Pulmonary/Chest: No stridor. He has no wheezes. He has no rales. He exhibits no tenderness.  Abdominal: He exhibits no distension. There is tenderness. There is no rebound.  Minimal tenderness throughout abd  Musculoskeletal: Normal range of motion. He exhibits no edema.   Lymphadenopathy:    He has no cervical adenopathy.  Neurological: He is oriented to person, place, and time. He exhibits normal muscle tone. Coordination normal.  Skin: No rash noted. No erythema.  Psychiatric: He has a normal mood and affect. His behavior is normal.    ED Course  Procedures (including critical care time) Labs Review Labs Reviewed  COMPREHENSIVE METABOLIC PANEL - Abnormal; Notable for the following:    Glucose, Bld 118 (*)    All other components within normal limits  CBC WITH DIFFERENTIAL/PLATELET  LIPASE, BLOOD  URINALYSIS, ROUTINE W REFLEX MICROSCOPIC    Imaging Review Dg Abd Acute W/chest  07/24/2014   CLINICAL DATA:  Nausea and vomiting for 4 5 days, abdominal pain  EXAM: ACUTE ABDOMEN SERIES (ABDOMEN 2 VIEW & CHEST 1 VIEW)  COMPARISON:  None.  FINDINGS: There is no evidence of dilated bowel loops or free intraperitoneal air. No radiopaque calculi or other significant radiographic abnormality is seen. Heart size and mediastinal contours are within normal limits. Both lungs are clear.  IMPRESSION: No acute abnormality noted.   Electronically Signed   By: Alcide Clever M.D.   On: 07/24/2014 11:28     EKG Interpretation None      MDM   Final diagnoses:  Pain  Acute vomiting    Nl tests,  Gastroenteritis,  tx with zofran, bentyl and follow up    Benny Lennert, MD 07/24/14 1245

## 2014-08-20 ENCOUNTER — Encounter: Payer: BLUE CROSS/BLUE SHIELD | Admitting: Internal Medicine

## 2015-05-10 ENCOUNTER — Encounter: Payer: Self-pay | Admitting: Family

## 2015-05-10 ENCOUNTER — Ambulatory Visit (INDEPENDENT_AMBULATORY_CARE_PROVIDER_SITE_OTHER): Payer: 59 | Admitting: Family

## 2015-05-10 VITALS — BP 140/62 | HR 64 | Temp 97.5°F | Resp 18 | Ht 74.0 in | Wt 202.0 lb

## 2015-05-10 DIAGNOSIS — J019 Acute sinusitis, unspecified: Secondary | ICD-10-CM | POA: Insufficient documentation

## 2015-05-10 DIAGNOSIS — J0100 Acute maxillary sinusitis, unspecified: Secondary | ICD-10-CM

## 2015-05-10 MED ORDER — AMOXICILLIN-POT CLAVULANATE 875-125 MG PO TABS: 1.0000 | ORAL_TABLET | Freq: Two times a day (BID) | ORAL | Status: DC

## 2015-05-10 NOTE — Progress Notes (Signed)
   Subjective:    Patient ID: Craig Weeks, male    DOB: 08/26/1990, 24 y.o.   MRN: [id number]  Chief Complaint  Patient presents with  . Cough    ear popping, runny nose, blurry vision, cough, sinus headache    HPI:  Craig Weeks is a 24 y.o. male who  has a past medical history of Asperger's disorder (05/01/2012) and Perforated eardrum. and presents today for an acute office visit.  This is a new problem. Associated symptoms of ear popping, runny nose, blurry vision, cough, and sinus headache and going on for approximately 3 weeks. Modifying factors include Mucinex and cough drops which have helped some but has not resolved the symptoms. Notes the course of the symptoms have progressively worsened. Severity of the cough is enough to disturb his sleep pattern. Denies any recent antibiotic use.   Allergies  Allergen Reactions  . Eggs Or Egg-Derived Products Nausea And Vomiting    bloating  . Milk-Related Compounds     REACTION: gas,bloating, stomach hurts  . Yeast-Related Products Other (See Comments)    Pain in the gut.     Current Outpatient Prescriptions on File Prior to Visit  Medication Sig Dispense Refill  . methylphenidate (DAYTRANA) 30 MG/9HR Place 1 patch onto the skin daily. wear patch for 9 hours only each day 90 patch 0  . methylphenidate (METADATE CD) 30 MG CR capsule Take 50 mg by mouth every morning.      No current facility-administered medications on file prior to visit.    Review of Systems  Constitutional: Negative for fever and chills.  HENT: Positive for congestion, sinus pressure and sore throat. Negative for ear pain.   Respiratory: Positive for cough and shortness of breath. Negative for chest tightness.   Neurological: Positive for headaches.      Objective:    BP 140/62 mmHg  Pulse 64  Temp(Src) 97.5 F (36.4 C) (Oral)  Resp 18  Ht 6\' 2"  (1.88 m)  Wt 202 lb (91.627 kg)  BMI 25.92 kg/m2  SpO2 96% Nursing note and vital signs  reviewed.  Physical Exam  Constitutional: He is oriented to person, place, and time. He appears well-developed and well-nourished. No distress.  HENT:  Right Ear: Hearing, tympanic membrane, external ear and ear canal normal.  Left Ear: Hearing, tympanic membrane, external ear and ear canal normal.  Nose: Right sinus exhibits maxillary sinus tenderness and frontal sinus tenderness. Left sinus exhibits maxillary sinus tenderness and frontal sinus tenderness.  Mouth/Throat: Uvula is midline, oropharynx is clear and moist and mucous membranes are normal.  Neck: Neck supple.  Cardiovascular: Normal rate, regular rhythm, normal heart sounds and intact distal pulses.   Pulmonary/Chest: Effort normal and breath sounds normal.  Neurological: He is alert and oriented to person, place, and time.  Skin: Skin is warm and dry.  Psychiatric: He has a normal mood and affect. His behavior is normal. Judgment and thought content normal.       Assessment & Plan:   Problem List Items Addressed This Visit      Respiratory   Sinusitis, acute - Primary    Symptoms and exam consistent with sinusitis. Start Augmentin. Continue over-the-counter medications as needed for symptom relief and supportive care. Follow-up if symptoms worsen or fail to improve.      Relevant Medications   amoxicillin-clavulanate (AUGMENTIN) 875-125 MG tablet

## 2015-05-10 NOTE — Patient Instructions (Signed)
Thank you for choosing [facility].  Summary/Instructions:  Your prescription(s) have been submitted to your pharmacy or been printed and provided for you. Please take as directed and contact our office if you believe you are having problem(s) with the medication(s) or have any questions.  If your symptoms worsen or fail to improve, please contact our office for further instruction, or in case of emergency go directly to the emergency room at the closest medical facility.   General Recommendations:    Please drink plenty of fluids.  Get plenty of rest   Sleep in humidified air  Use saline nasal sprays  Netti pot   OTC Medications:  Decongestants - helps relieve congestion   Flonase (generic fluticasone) or Nasacort (generic triamcinolone) - please make sure to use the "cross-over" technique at a 45 degree angle towards the opposite eye as opposed to straight up the nasal passageway.   Sudafed (generic pseudoephedrine - Note this is the one that is available behind the pharmacy counter); Products with phenylephrine (-PE) may also be used but is often not as effective as pseudoephedrine.   If you have HIGH BLOOD PRESSURE - Coricidin HBP; AVOID any product that is -D as this contains pseudoephedrine which may increase your blood pressure.  Afrin (oxymetazoline) every 6-8 hours for up to 3 days.   Allergies - helps relieve runny nose, itchy eyes and sneezing   Claritin (generic loratidine), Allegra (fexofenidine), or Zyrtec (generic cyrterizine) for runny nose. These medications should not cause drowsiness.  Note - Benadryl (generic diphenhydramine) may be used however may cause drowsiness  Cough -   Delsym or Robitussin (generic dextromethorphan)  Expectorants - helps loosen mucus to ease removal   Mucinex (generic guaifenesin) as directed on the package.  Headaches / General Aches   Tylenol (generic acetaminophen) - DO NOT EXCEED 3 grams (3,000 mg) in a 24  hour time period  Advil/Motrin (generic ibuprofen)   Sore Throat -   Salt water gargle   Chloraseptic (generic benzocaine) spray or lozenges / Sucrets (generic dyclonine)    Sinusitis Sinusitis is redness, soreness, and inflammation of the paranasal sinuses. Paranasal sinuses are air pockets within the bones of your face (beneath the eyes, the middle of the forehead, or above the eyes). In healthy paranasal sinuses, mucus is able to drain out, and air is able to circulate through them by way of your nose. However, when your paranasal sinuses are inflamed, mucus and air can become trapped. This can allow bacteria and other germs to grow and cause infection. Sinusitis can develop quickly and last only a short time (acute) or continue over a long period (chronic). Sinusitis that lasts for more than 12 weeks is considered chronic.  CAUSES  Causes of sinusitis include:  Allergies.  Structural abnormalities, such as displacement of the cartilage that separates your nostrils (deviated septum), which can decrease the air flow through your nose and sinuses and affect sinus drainage.  Functional abnormalities, such as when the small hairs (cilia) that line your sinuses and help remove mucus do not work properly or are not present. SIGNS AND SYMPTOMS  Symptoms of acute and chronic sinusitis are the same. The primary symptoms are pain and pressure around the affected sinuses. Other symptoms include:  Upper toothache.  Earache.  Headache.  Bad breath.  Decreased sense of smell and taste.  A cough, which worsens when you are lying flat.  Fatigue.  Fever.  Thick drainage from your nose, which often is green and may   contain pus (purulent).  Swelling and warmth over the affected sinuses. DIAGNOSIS  Your health care provider will perform a physical exam. During the exam, your health care provider may:  Look in your nose for signs of abnormal growths in your nostrils (nasal  polyps).  Tap over the affected sinus to check for signs of infection.  View the inside of your sinuses (endoscopy) using an imaging device that has a light attached (endoscope). If your health care provider suspects that you have chronic sinusitis, one or more of the following tests may be recommended:  Allergy tests.  Nasal culture. A sample of mucus is taken from your nose, sent to a lab, and screened for bacteria.  Nasal cytology. A sample of mucus is taken from your nose and examined by your health care provider to determine if your sinusitis is related to an allergy. TREATMENT  Most cases of acute sinusitis are related to a viral infection and will resolve on their own within 10 days. Sometimes medicines are prescribed to help relieve symptoms (pain medicine, decongestants, nasal steroid sprays, or saline sprays).  However, for sinusitis related to a bacterial infection, your health care provider will prescribe antibiotic medicines. These are medicines that will help kill the bacteria causing the infection.  Rarely, sinusitis is caused by a fungal infection. In theses cases, your health care provider will prescribe antifungal medicine. For some cases of chronic sinusitis, surgery is needed. Generally, these are cases in which sinusitis recurs more than 3 times per year, despite other treatments. HOME CARE INSTRUCTIONS   Drink plenty of water. Water helps thin the mucus so your sinuses can drain more easily.  Use a humidifier.  Inhale steam 3 to 4 times a day (for example, sit in the bathroom with the shower running).  Apply a warm, moist washcloth to your face 3 to 4 times a day, or as directed by your health care provider.  Use saline nasal sprays to help moisten and clean your sinuses.  Take medicines only as directed by your health care provider.  If you were prescribed either an antibiotic or antifungal medicine, finish it all even if you start to feel better. SEEK IMMEDIATE  MEDICAL CARE IF:  You have increasing pain or severe headaches.  You have nausea, vomiting, or drowsiness.  You have swelling around your face.  You have vision problems.  You have a stiff neck.  You have difficulty breathing. MAKE SURE YOU:   Understand these instructions.  Will watch your condition.  Will get help right away if you are not doing well or get worse. Document Released: 05/15/2005 Document Revised: 09/29/2013 Document Reviewed: 05/30/2011 Exit[employer] Patient Information 2015 [employer]. This information is not intended to replace advice given to you by your health care provider. Make sure you discuss any questions you have with your health care provider.   

## 2015-05-10 NOTE — Progress Notes (Signed)
Pre visit review using our clinic review tool, if applicable. No additional management support is needed unless otherwise documented below in the visit note. 

## 2015-05-10 NOTE — Assessment & Plan Note (Signed)
Symptoms and exam consistent with sinusitis. Start Augmentin. Continue over-the-counter medications as needed for symptom relief and supportive care. Follow-up if symptoms worsen or fail to improve. 

## 2015-08-20 ENCOUNTER — Ambulatory Visit (INDEPENDENT_AMBULATORY_CARE_PROVIDER_SITE_OTHER): Payer: 59 | Admitting: Internal Medicine

## 2015-08-20 ENCOUNTER — Encounter: Payer: Self-pay | Admitting: Internal Medicine

## 2015-08-20 VITALS — BP 130/82 | HR 66 | Temp 98.6°F | Resp 20 | Wt 198.0 lb

## 2015-08-20 DIAGNOSIS — J069 Acute upper respiratory infection, unspecified: Secondary | ICD-10-CM | POA: Diagnosis not present

## 2015-08-20 DIAGNOSIS — R05 Cough: Secondary | ICD-10-CM

## 2015-08-20 DIAGNOSIS — J309 Allergic rhinitis, unspecified: Secondary | ICD-10-CM | POA: Diagnosis not present

## 2015-08-20 DIAGNOSIS — R053 Chronic cough: Secondary | ICD-10-CM | POA: Insufficient documentation

## 2015-08-20 MED ORDER — HYDROCODONE-HOMATROPINE 5-1.5 MG/5ML PO SYRP: 5.0000 mL | ORAL_SOLUTION | Freq: Four times a day (QID) | ORAL | Status: DC | PRN

## 2015-08-20 MED ORDER — AZITHROMYCIN 250 MG PO TABS: ORAL_TABLET | ORAL | Status: DC

## 2015-08-20 MED ORDER — TRIAMCINOLONE ACETONIDE 55 MCG/ACT NA AERO: 2.0000 | INHALATION_SPRAY | Freq: Every day | NASAL | Status: DC

## 2015-08-20 MED ORDER — PREDNISONE 10 MG PO TABS: ORAL_TABLET | ORAL | Status: DC

## 2015-08-20 MED ORDER — CETIRIZINE HCL 10 MG PO TABS: 10.0000 mg | ORAL_TABLET | Freq: Every day | ORAL | Status: DC

## 2015-08-20 NOTE — Progress Notes (Signed)
Subjective:    Patient ID: Craig Weeks, male    DOB: 04/30/1991, 25 y.o.   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  Has intermittent ST, and may have had a rare wheeze with chest congestion but Pt denies chest pain, increased sob or doe, orthopnea, PND, increased LE swelling, palpitations, dizziness or syncope. Pt denies new neurological symptoms such as new headache, or facial or extremity weakness or numbness   Pt denies polydipsia, polyuria. Denies worsening reflux, abd pain, dysphagia, n/v, bowel change or blood. Past Medical History  Diagnosis Date  . Asperger's disorder 05/01/2012  . Perforated eardrum     Right side   Past Surgical History  Procedure Laterality Date  . Ganglion cyst excision      right  . Circumcision      Age 56    reports that he has never smoked. He has never used smokeless tobacco. He reports that he does not drink alcohol or use illicit drugs. family history includes ADD / ADHD in his father; Alcohol abuse in his maternal grandmother; Bipolar disorder in his maternal grandmother; Crohn's disease in his father; Dementia in his paternal grandfather; Drug abuse in his cousin; Gallstones in his father; Kidney Stones in his father; Prostate cancer in his maternal grandfather. Allergies  Allergen Reactions  . Eggs Or Egg-Derived Products Nausea And Vomiting    bloating  . Milk-Related Compounds     REACTION: gas,bloating, stomach hurts  . Yeast-Related Products Other (See Comments)    Pain in the gut.   Current Outpatient Prescriptions on File Prior to Visit  Medication Sig Dispense Refill  . methylphenidate (DAYTRANA) 30 MG/9HR Place 1 patch onto the skin daily. wear patch for 9 hours only each day  90 patch 0  . methylphenidate (METADATE CD) 30 MG CR capsule Take 50 mg by mouth every morning.      No current facility-administered medications on file prior to visit.   Review of Systems  Constitutional: Negative for unusual diaphoresis or night sweats HENT: Negative for ringing in ear or discharge Eyes: Negative for double vision or worsening visual disturbance.  Respiratory: Negative for choking and stridor.   Gastrointestinal: Negative for vomiting or other signifcant bowel change Genitourinary: Negative for hematuria or change in urine volume.  Musculoskeletal: Negative for other MSK pain or swelling Skin: Negative for color change and worsening wound.  Neurological: Negative for tremors and numbness other than noted  Psychiatric/Behavioral: Negative for decreased concentration or agitation other than above       Objective:   Physical Exam BP 130/82 mmHg  Pulse 66  Temp(Src) 98.6 F (37 C) (Oral)  Resp 20  Wt 198 lb (89.812 kg)  SpO2 99% VS noted, not ill appaering, few non prod cough during exam Constitutional: Pt appears in no significant distress HENT: Head: NCAT.  Right Ear: External ear normal.  Left Ear: External ear normal.  Bilat tm's with mild erythema left > right with mild effusion on left.  Max sinus areas non tender.  Pharynx with mild erythema, and cobblestoning/post nasal gtt but also right tonsil enlarged with pustular cover Eyes: . Pupils are equal, round, and reactive to light. Conjunctivae and EOM are normal Neck: Normal range of motion. Neck supple.with bilat mild tender  submandib LA  Cardiovascular: Normal rate and regular rhythm.   Pulmonary/Chest: Effort normal and breath sounds without rales or wheezing.  Neurological: Pt is alert. Not confused , motor grossly intact Skin: Skin is warm. No rash, no LE edema Psychiatric: Pt behavior is normal. No agitation.       Assessment & Plan:

## 2015-08-20 NOTE — Progress Notes (Signed)
Pre visit review using our clinic review tool, if applicable. No additional management support is needed unless otherwise documented below in the visit note. 

## 2015-08-20 NOTE — Assessment & Plan Note (Signed)
Mild to mod, for antibx course,  to f/u any worsening symptoms or concerns 

## 2015-08-20 NOTE — Assessment & Plan Note (Signed)
With tx as above, with zyrtec/nasacort, consider allergy referral

## 2015-08-20 NOTE — Assessment & Plan Note (Signed)
By exam most likely related to post nasal gtt/allergic rhinitis - for predpac asd, zyrtec/nasacort asd,  to f/u any worsening symptoms or concerns, consider allergy referral

## 2015-08-20 NOTE — Patient Instructions (Signed)
Please take all new medication as prescribed - the antibiotic, cough medicine, and prednisone  Please take all new medication as prescribed  - the zyrtec and nasacort in about a week to help keep the allergy symptoms controlled  Please call in 1-2 weeks for referral to an allergist and a Chest xray if not improved  Please continue all other medications as before, and refills have been done if requested.  Please have the pharmacy call with any other refills you may need.  Please keep your appointments with your specialists as you may have planned

## 2016-01-27 IMAGING — CR DG ABDOMEN ACUTE W/ 1V CHEST
3 series · 3 of 3 positions shown · non-contrast
Comparison: None.

CLINICAL DATA: Nausea and vomiting for 4 5 days, abdominal pain

EXAM:
ACUTE ABDOMEN SERIES (ABDOMEN 2 VIEW & CHEST 1 VIEW)

[w chest pa]
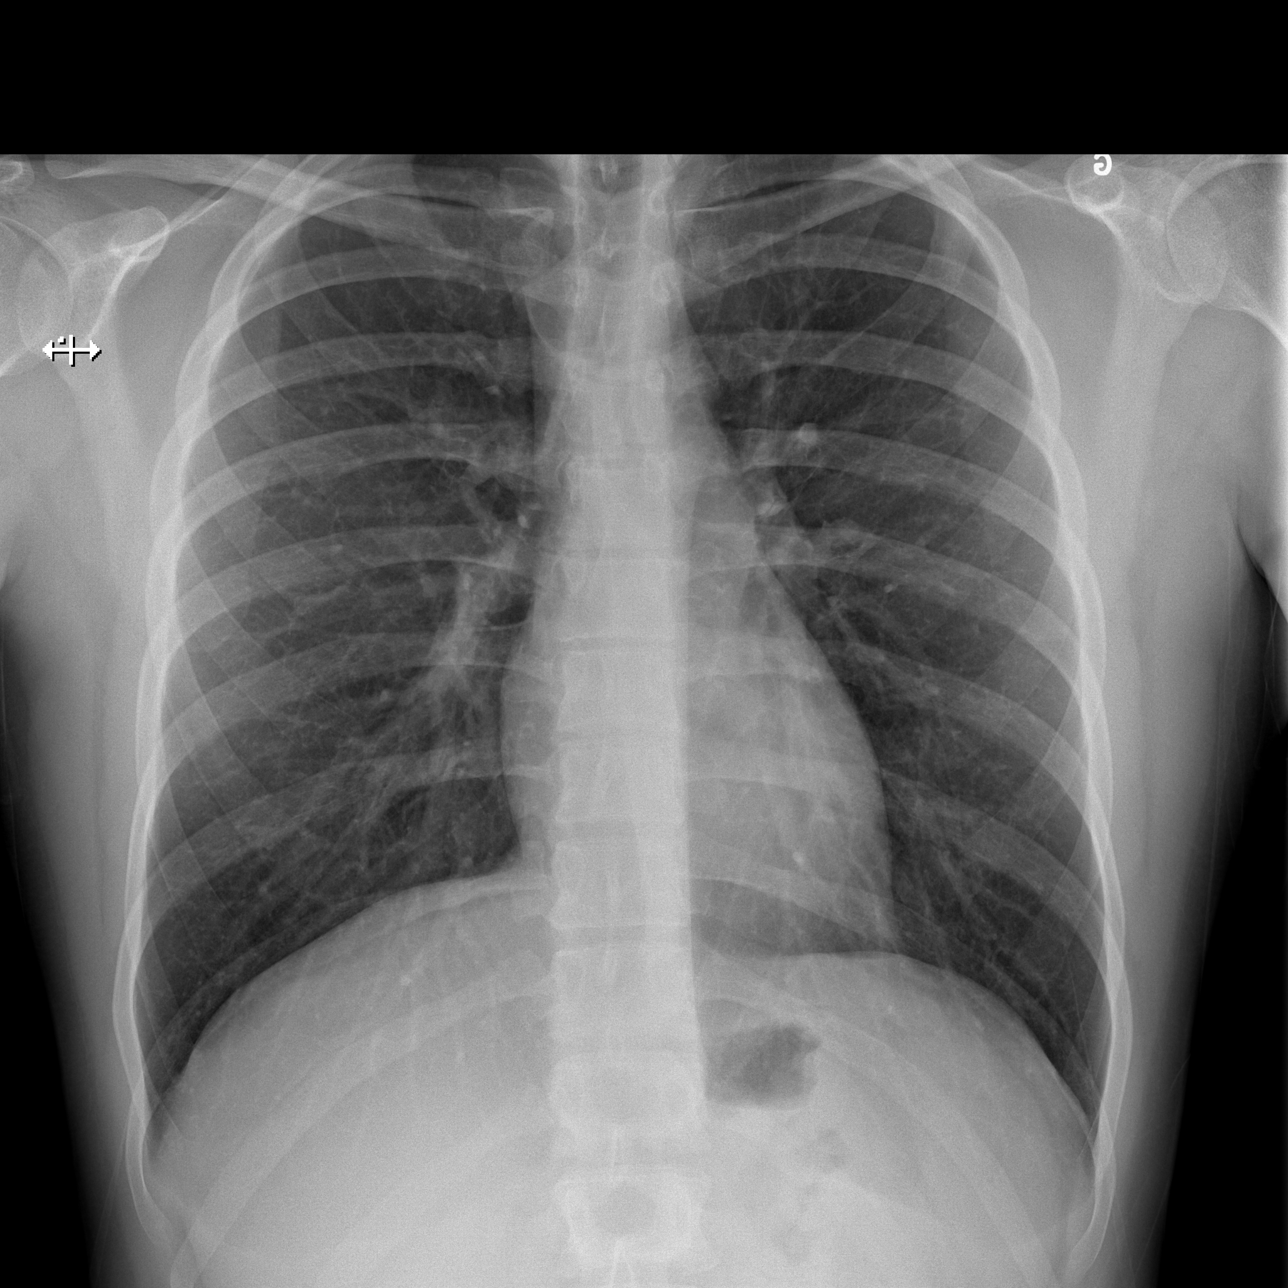

[w abdomen upright]
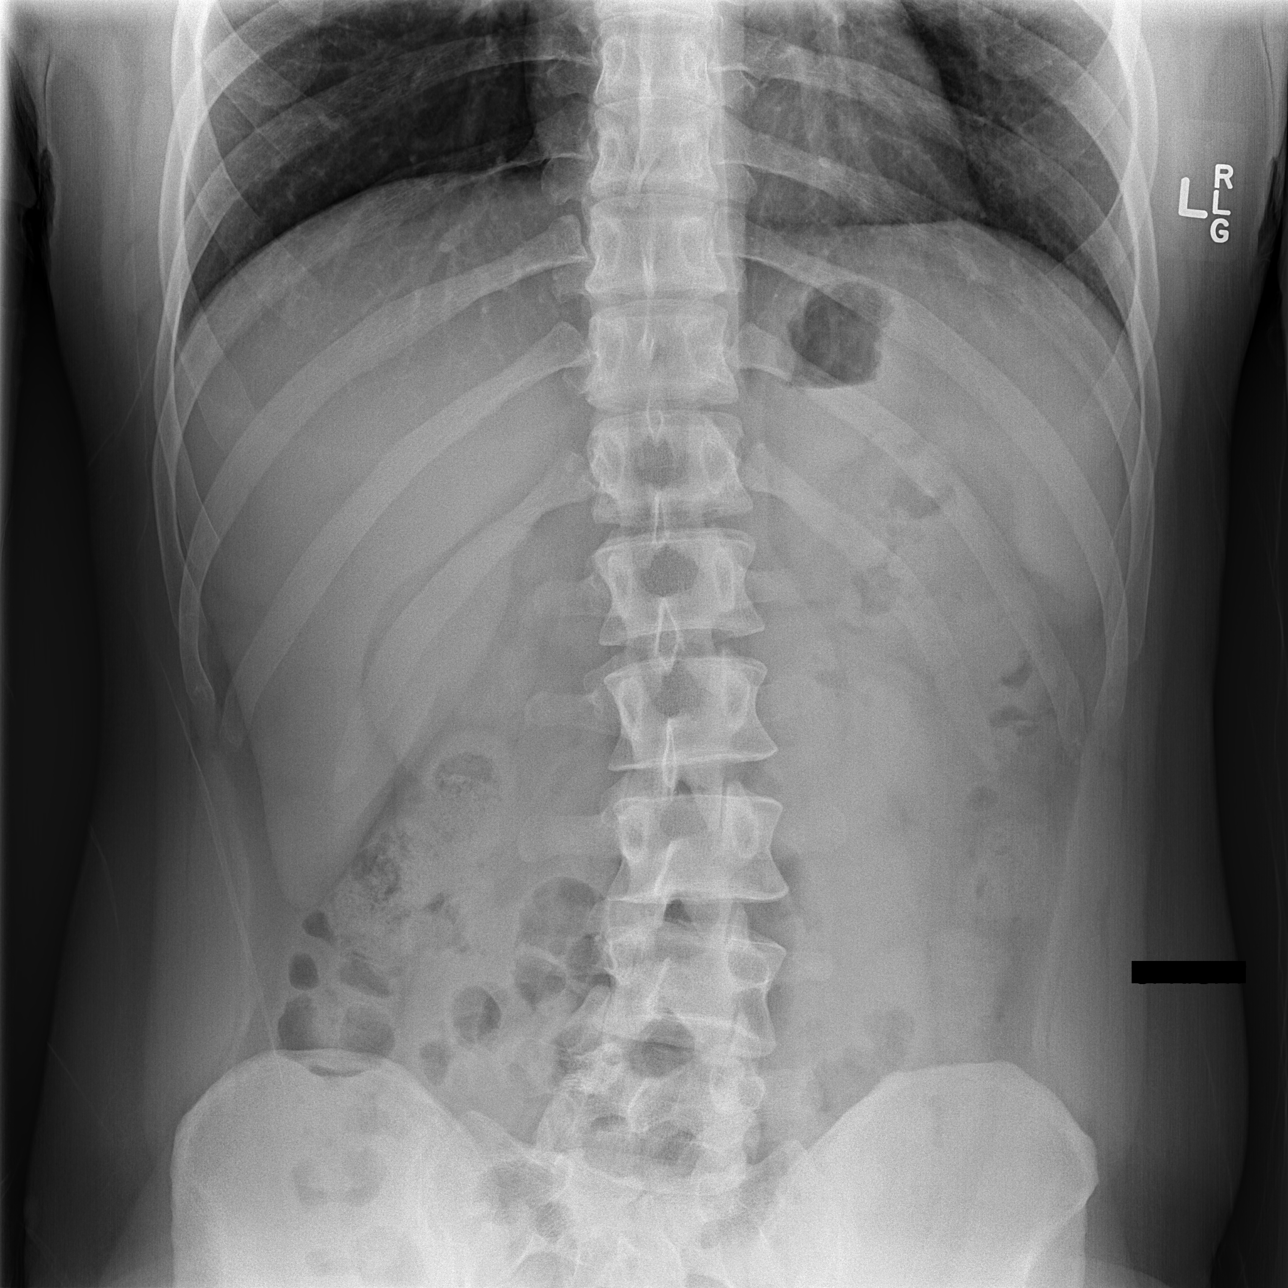

[t abdomen supine]
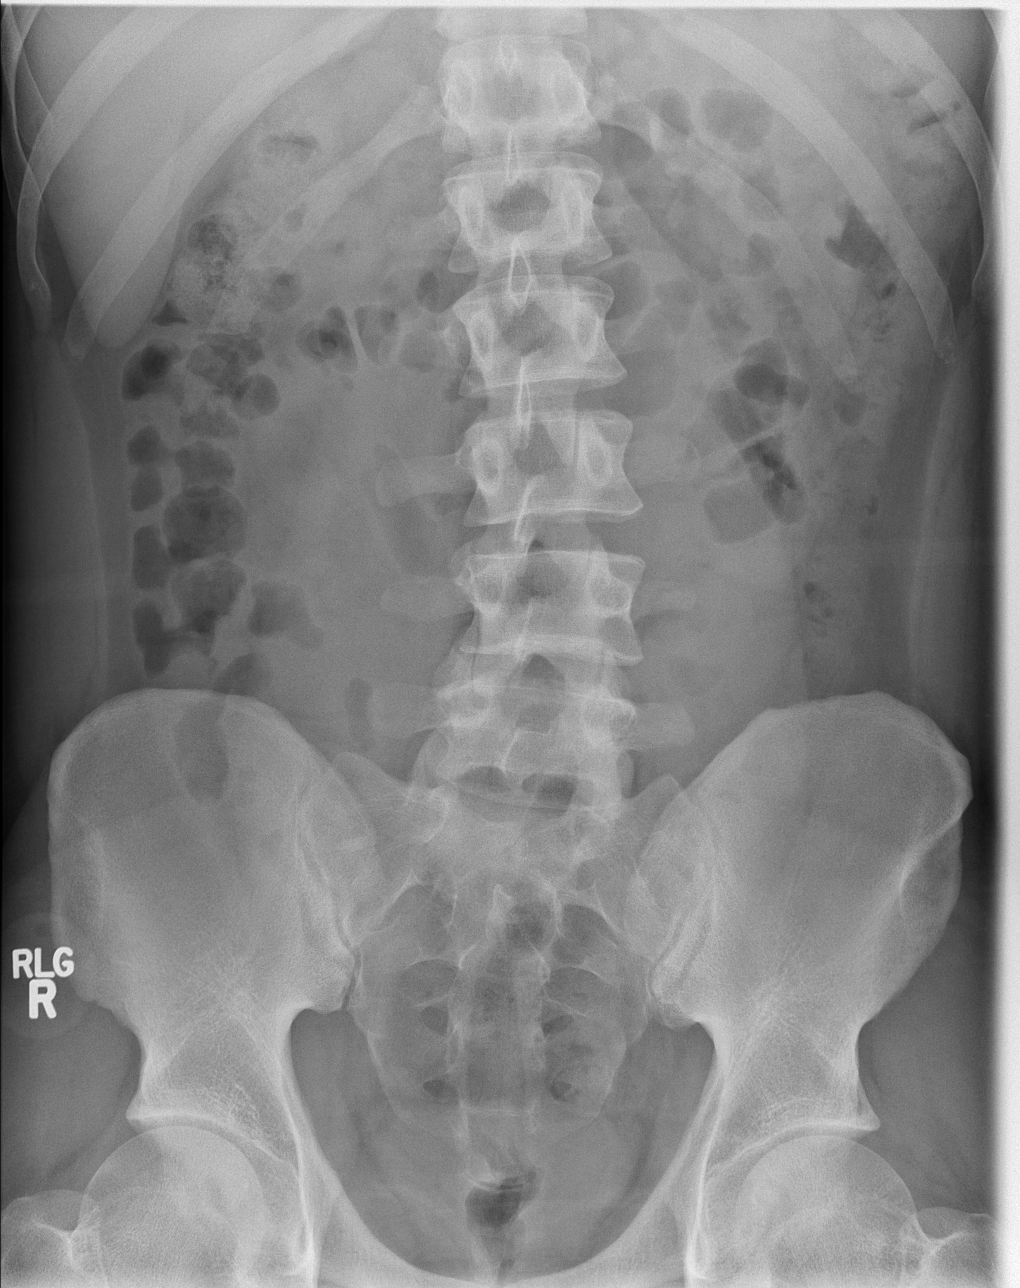

[3 of 3 positions shown; findings below may reference images not displayed]

FINDINGS: There is no evidence of dilated bowel loops or free intraperitoneal
air. No radiopaque calculi or other significant radiographic
abnormality is seen. Heart size and mediastinal contours are within
normal limits. Both lungs are clear.
IMPRESSION: No acute abnormality noted.

## 2016-07-13 DIAGNOSIS — F900 Attention-deficit hyperactivity disorder, predominantly inattentive type: Secondary | ICD-10-CM | POA: Diagnosis not present

## 2016-07-13 DIAGNOSIS — F840 Autistic disorder: Secondary | ICD-10-CM | POA: Diagnosis not present

## 2016-10-10 DIAGNOSIS — F900 Attention-deficit hyperactivity disorder, predominantly inattentive type: Secondary | ICD-10-CM | POA: Diagnosis not present

## 2016-10-10 DIAGNOSIS — F840 Autistic disorder: Secondary | ICD-10-CM | POA: Diagnosis not present

## 2016-10-31 ENCOUNTER — Telehealth: Payer: Self-pay | Admitting: *Deleted

## 2016-10-31 ENCOUNTER — Ambulatory Visit (INDEPENDENT_AMBULATORY_CARE_PROVIDER_SITE_OTHER): Payer: BLUE CROSS/BLUE SHIELD | Admitting: Podiatry

## 2016-10-31 ENCOUNTER — Encounter: Payer: Self-pay | Admitting: Podiatry

## 2016-10-31 VITALS — BP 141/101 | HR 76

## 2016-10-31 DIAGNOSIS — L600 Ingrowing nail: Secondary | ICD-10-CM

## 2016-10-31 DIAGNOSIS — L03032 Cellulitis of left toe: Secondary | ICD-10-CM

## 2016-10-31 DIAGNOSIS — L03031 Cellulitis of right toe: Secondary | ICD-10-CM

## 2016-10-31 MED ORDER — CEPHALEXIN 500 MG PO CAPS: 500.0000 mg | ORAL_CAPSULE | Freq: Two times a day (BID) | ORAL | 0 refills | Status: DC

## 2016-10-31 NOTE — Progress Notes (Signed)
   Subjective:    Patient ID: Craig Weeks, male    DOB: 05/31/1990, 26 y.o.   MRN: [id number]  HPI this patient presents the office with chief complaint of a painful big toes on both feet. He says he developed a painful ingrowing toenails last week.  He says he has self treated himself with failure soaks and make sure [place], but the problem continues and is worsening. He presents the office today stating he has sought no professional help for this condition. He presents the office today for an evaluation and treatment of his painful infected ingrowing toenail    Review of Systems  HENT:       Ringing in ears  Musculoskeletal: Positive for back pain.       Muscle pain,Joint pain       Objective:   Physical Exam GENERAL APPEARANCE: Alert, conversant. Appropriately groomed. No acute distress.  VASCULAR: [facility]  [facility].  Capillary refill time is immediate to all digits,  Normal temperature gradient.  Digital hair growth is present bilateral  NEUROLOGIC: sensation is normal to 5.07 monofilament at 5/5 sites bilateral.  Light touch is intact bilateral, Muscle strength normal.  MUSCULOSKELETAL: acceptable muscle strength, tone and stability bilateral.  Intrinsic muscluature intact bilateral.  Rectus appearance of foot and digits noted bilateral.  NAILS  marked incurvation noted along the medial borders of both great toes. Redness and swelling noted along the medial borders with granulation tissue noted in the right hallux DERMATOLOGIC: skin color, texture, and turgor are within normal limits.  No preulcerative lesions or ulcers  are seen, no interdigital maceration noted.  No open lesions present.  . No drainage noted.         Assessment & Plan:  Paronychia medial border  Hallux  B/L  IE  Nail surgery.  Treatment options and alternatives discussed.  Recommended permanent phenol matrixectomy and patient agreed.  Both hallux were prepped with alcohol and  a toe block of 3cc of 2% lidocaine plain was administered in a digital toe block. .  The left  toe was then prepped with betadine solution .  The offending nail border was then excised and matrix tissue exposed.  Phenol was then applied to the matrix tissue followed by an alcohol wash.  Antibiotic ointment and a dry sterile dressing was applied.  The same procedure was performed right great toe.  Prescribed cephalexin 500 mg.  Home instructions given.  RTC  1 week.  Told to take advil or acetominophen for pain.       Helane GuntherGregory Cortlandt Capuano DPM

## 2016-11-10 ENCOUNTER — Encounter: Payer: Self-pay | Admitting: Podiatry

## 2016-11-10 ENCOUNTER — Ambulatory Visit (INDEPENDENT_AMBULATORY_CARE_PROVIDER_SITE_OTHER): Payer: BLUE CROSS/BLUE SHIELD | Admitting: Podiatry

## 2016-11-10 DIAGNOSIS — Z09 Encounter for follow-up examination after completed treatment for conditions other than malignant neoplasm: Secondary | ICD-10-CM

## 2016-11-10 NOTE — Progress Notes (Signed)
This patient returns to the office following nail surgery one week ago.  The patient says toe has been soaked and bandaged as directed.  There has been improvement of the toe since the surgery has been performed. The patient presents for continued evaluation and treatment.  GENERAL APPEARANCE: Alert, conversant. Appropriately groomed. No acute distress.  VASCULAR: Pedal pulses palpable at  DP and PT bilateral.  Capillary refill time is immediate to all digits,  Normal temperature gradient.    NEUROLOGIC: sensation is normal to 5.07 monofilament at 5/5 sites bilateral.  Light touch is intact bilateral, Muscle strength normal.  MUSCULOSKELETAL: acceptable muscle strength, tone and stability bilateral.  Intrinsic muscluature intact bilateral.  Rectus appearance of foot and digits noted bilateral.   DERMATOLOGIC: skin color, texture, and turgor are within normal limits.  No preulcerative lesions or ulcers  are seen, no interdigital maceration noted.   NAILS  There is necrotic tissue along the nail groove  In the absence of redness swelling and pain.  DX  S/p nail surgery  ROV  Home instructions were discussed.  Patient to call the office if there are any questions or concerns.   Craig Weeks DPM   

## 2016-11-17 NOTE — Telephone Encounter (Signed)
error 

## 2017-01-15 DIAGNOSIS — F840 Autistic disorder: Secondary | ICD-10-CM | POA: Diagnosis not present

## 2017-01-15 DIAGNOSIS — F900 Attention-deficit hyperactivity disorder, predominantly inattentive type: Secondary | ICD-10-CM | POA: Diagnosis not present

## 2017-06-28 DIAGNOSIS — F900 Attention-deficit hyperactivity disorder, predominantly inattentive type: Secondary | ICD-10-CM | POA: Diagnosis not present

## 2017-06-28 DIAGNOSIS — F840 Autistic disorder: Secondary | ICD-10-CM | POA: Diagnosis not present

## 2017-07-05 ENCOUNTER — Encounter: Payer: Self-pay | Admitting: Internal Medicine

## 2017-07-18 ENCOUNTER — Other Ambulatory Visit (INDEPENDENT_AMBULATORY_CARE_PROVIDER_SITE_OTHER): Payer: BLUE CROSS/BLUE SHIELD

## 2017-07-18 ENCOUNTER — Ambulatory Visit (INDEPENDENT_AMBULATORY_CARE_PROVIDER_SITE_OTHER): Payer: BLUE CROSS/BLUE SHIELD | Admitting: Internal Medicine

## 2017-07-18 ENCOUNTER — Encounter: Payer: Self-pay | Admitting: Internal Medicine

## 2017-07-18 VITALS — BP 122/82 | HR 64 | Temp 98.2°F | Ht 74.0 in | Wt 213.0 lb

## 2017-07-18 DIAGNOSIS — Z0000 Encounter for general adult medical examination without abnormal findings: Secondary | ICD-10-CM

## 2017-07-18 DIAGNOSIS — Z114 Encounter for screening for human immunodeficiency virus [HIV]: Secondary | ICD-10-CM | POA: Diagnosis not present

## 2017-07-18 DIAGNOSIS — R739 Hyperglycemia, unspecified: Secondary | ICD-10-CM

## 2017-07-18 LAB — HEPATIC FUNCTION PANEL
ALBUMIN: 4.7 g/dL (ref 3.5–5.2)
ALK PHOS: 80 U/L (ref 39–117)
ALT: 20 U/L (ref 0–53)
AST: 15 U/L (ref 0–37)
Bilirubin, Direct: 0.1 mg/dL (ref 0.0–0.3)
Total Bilirubin: 0.6 mg/dL (ref 0.2–1.2)
Total Protein: 7.3 g/dL (ref 6.0–8.3)

## 2017-07-18 LAB — BASIC METABOLIC PANEL
BUN: 13 mg/dL (ref 6–23)
CALCIUM: 9.3 mg/dL (ref 8.4–10.5)
CHLORIDE: 104 meq/L (ref 96–112)
CO2: 30 mEq/L (ref 19–32)
CREATININE: 0.91 mg/dL (ref 0.40–1.50)
GFR: 105.79 mL/min (ref >60.00)
Glucose, Bld: 107 mg/dL — ABNORMAL HIGH (ref 70–99)
Potassium: 4.3 mEq/L (ref 3.5–5.1)
Sodium: 139 mEq/L (ref 135–145)

## 2017-07-18 LAB — CBC WITH DIFFERENTIAL/PLATELET
Basophils Absolute: 0.1 10*3/uL (ref 0.0–0.1)
Basophils Relative: 1.1 % (ref 0.0–3.0)
Eosinophils Absolute: 0.1 10*3/uL (ref 0.0–0.7)
Eosinophils Relative: 1.5 % (ref 0.0–5.0)
HCT: 46.1 % (ref 39.0–52.0)
HEMOGLOBIN: 15.5 g/dL (ref 13.0–17.0)
LYMPHS PCT: 40.4 % (ref 12.0–46.0)
Lymphs Abs: 2.2 10*3/uL (ref 0.7–4.0)
MCHC: 33.6 g/dL (ref 30.0–36.0)
MCV: 91.6 fl (ref 78.0–100.0)
MONOS PCT: 8.1 % (ref 3.0–12.0)
Monocytes Absolute: 0.4 10*3/uL (ref 0.1–1.0)
Neutro Abs: 2.6 10*3/uL (ref 1.4–7.7)
Neutrophils Relative %: 48.9 % (ref 43.0–77.0)
Platelets: 321 10*3/uL (ref 150.0–400.0)
RBC: 5.03 Mil/uL (ref 4.22–5.81)
RDW: 13.9 % (ref 11.5–15.5)
WBC: 5.3 10*3/uL (ref 4.0–10.5)

## 2017-07-18 LAB — LIPID PANEL
CHOLESTEROL: 136 mg/dL (ref 0–200)
HDL: 25.5 mg/dL — ABNORMAL LOW (ref >39.00)
LDL CALC: 95 mg/dL (ref 0–99)
NonHDL: 110.11
TRIGLYCERIDES: 78 mg/dL (ref 0.0–149.0)
Total CHOL/HDL Ratio: 5
VLDL: 15.6 mg/dL (ref 0.0–40.0)

## 2017-07-18 LAB — URINALYSIS, ROUTINE W REFLEX MICROSCOPIC
BILIRUBIN URINE: NEGATIVE
Hgb urine dipstick: NEGATIVE
KETONES UR: NEGATIVE
Leukocytes, UA: NEGATIVE
NITRITE: NEGATIVE
RBC / HPF: NONE SEEN (ref 0–?)
SPECIFIC GRAVITY, URINE: 1.025 (ref 1.000–1.030)
Total Protein, Urine: NEGATIVE
URINE GLUCOSE: NEGATIVE
UROBILINOGEN UA: 0.2 (ref 0.0–1.0)
pH: 6 (ref 5.0–8.0)

## 2017-07-18 LAB — HEMOGLOBIN A1C: HEMOGLOBIN A1C: 5.4 % (ref 4.6–6.5)

## 2017-07-18 LAB — TSH: TSH: 3.62 u[IU]/mL (ref 0.35–4.50)

## 2017-07-18 NOTE — Progress Notes (Signed)
Subjective:    Patient ID: Craig Weeks, male    DOB: April 23, 1991, 27 y.o.   MRN: [id number]  HPI  Here for wellness and f/u;  Overall doing ok;  Pt denies Chest pain, worsening SOB, DOE, wheezing, orthopnea, PND, worsening LE edema, palpitations, dizziness or syncope.  Pt denies neurological change such as new headache, facial or extremity weakness.  Pt denies polydipsia, polyuria, or low sugar symptoms. Pt states overall good compliance with treatment and medications, good tolerability, and has been trying to follow appropriate diet.  Pt denies worsening depressive symptoms, suicidal ideation or panic. No fever, night sweats, wt loss, loss of appetite, or other constitutional symptoms.  Pt states good ability with ADL's, has low fall risk, home safety reviewed and adequate, no other significant changes in hearing or vision, and occasionally active with exercise. Declines flu shot Past Medical History:  Diagnosis Date  . Asperger's disorder 05/01/2012  . Perforated eardrum    Right side   Past Surgical History:  Procedure Laterality Date  . CIRCUMCISION     Age 53  . GANGLION CYST EXCISION     right    reports that  has never smoked. he has never used smokeless tobacco. He reports that he does not drink alcohol or use drugs. family history includes ADD / ADHD in his father; Alcohol abuse in his maternal grandmother; Bipolar disorder in his maternal grandmother; Crohn's disease in his father; Dementia in his paternal grandfather; Drug abuse in his cousin; Gallstones in his father; Kidney Stones in his father; Prostate cancer in his maternal grandfather. Allergies  Allergen Reactions  . Eggs Or Egg-Derived Products Nausea And Vomiting and Other (See Comments)    Gas and bloated bloating  . Lac Bovis Other (See Comments)    REACTION: gas,bloating, stomach hurts  . Lactose Other (See Comments)    Gas and bloated  . Yeast-Related Products Other (See Comments)    Pain in the gut. Pain in  the gut.  . Milk-Related Compounds     REACTION: gas,bloating, stomach hurts  . Yeast Nausea Only   Current Outpatient Medications on File Prior to Visit  Medication Sig Dispense Refill  . CONCERTA 54 MG CR tablet   0   No current facility-administered medications on file prior to visit.    Review of Systems Constitutional: Negative for other unusual diaphoresis, sweats, appetite or weight changes HENT: Negative for other worsening hearing loss, ear pain, facial swelling, mouth sores or neck stiffness.   Eyes: Negative for other worsening pain, redness or other visual disturbance.  Respiratory: Negative for other stridor or swelling Cardiovascular: Negative for other palpitations or other chest pain  Gastrointestinal: Negative for worsening diarrhea or loose stools, blood in stool, distention or other pain Genitourinary: Negative for hematuria, flank pain or other change in urine volume.  Musculoskeletal: Negative for myalgias or other joint swelling.  Skin: Negative for other color change, or other wound or worsening drainage.  Neurological: Negative for other syncope or numbness. Hematological: Negative for other adenopathy or swelling Psychiatric/Behavioral: Negative for hallucinations, other worsening agitation, SI, self-injury, or new decreased concentration All other system neg per pt    Objective:   Physical Exam BP 122/82   Pulse 64   Temp 98.2 F (36.8 C) (Oral)   Ht 6\' 2"  (1.88 m)   Wt 213 lb (96.6 kg)   SpO2 98%   BMI 27.35 kg/m  VS noted, not ill appaering Constitutional: Pt is oriented to person, place,  and time. Appears well-developed and well-nourished, in no significant distress and comfortable Head: Normocephalic and atraumatic  Eyes: Conjunctivae and EOM are normal. Pupils are equal, round, and reactive to light Right Ear: External ear normal without discharge Left Ear: External ear normal without discharge Nose: Nose without discharge or  deformity Mouth/Throat: Oropharynx is without other ulcerations and moist  Neck: Normal range of motion. Neck supple. No JVD present. No tracheal deviation present or significant neck LA or mass Cardiovascular: Normal rate, regular rhythm, normal heart sounds and intact distal pulses.   Pulmonary/Chest: WOB normal and breath sounds without rales or wheezing  Abdominal: Soft. Bowel sounds are normal. NT. No HSM  Musculoskeletal: Normal range of motion. Exhibits no edema Lymphadenopathy: Has no other cervical adenopathy.  Neurological: Pt is alert and oriented to person, place, and time. Pt has normal reflexes. No cranial nerve deficit. Motor grossly intact, Gait intact Skin: Skin is warm and dry. No rash noted or new ulcerations Psychiatric:  Has normal mood and affect. Behavior is normal without agitation No other exam findings    Assessment & Plan:

## 2017-07-18 NOTE — Patient Instructions (Signed)

## 2017-07-19 LAB — HIV ANTIBODY (ROUTINE TESTING W REFLEX): HIV 1&2 Ab, 4th Generation: NONREACTIVE

## 2017-07-21 ENCOUNTER — Encounter: Payer: Self-pay | Admitting: Internal Medicine

## 2017-07-21 NOTE — Assessment & Plan Note (Signed)
Asympt, mild, for a1c with labs 

## 2017-07-21 NOTE — Assessment & Plan Note (Signed)

## 2017-09-20 DIAGNOSIS — F900 Attention-deficit hyperactivity disorder, predominantly inattentive type: Secondary | ICD-10-CM | POA: Diagnosis not present

## 2017-09-20 DIAGNOSIS — F840 Autistic disorder: Secondary | ICD-10-CM | POA: Diagnosis not present

## 2017-12-20 DIAGNOSIS — F840 Autistic disorder: Secondary | ICD-10-CM | POA: Diagnosis not present

## 2017-12-20 DIAGNOSIS — F900 Attention-deficit hyperactivity disorder, predominantly inattentive type: Secondary | ICD-10-CM | POA: Diagnosis not present

## 2018-02-22 DIAGNOSIS — H6123 Impacted cerumen, bilateral: Secondary | ICD-10-CM | POA: Diagnosis not present

## 2018-02-22 DIAGNOSIS — H66001 Acute suppurative otitis media without spontaneous rupture of ear drum, right ear: Secondary | ICD-10-CM | POA: Diagnosis not present

## 2018-03-28 DIAGNOSIS — F840 Autistic disorder: Secondary | ICD-10-CM | POA: Diagnosis not present

## 2018-03-28 DIAGNOSIS — F900 Attention-deficit hyperactivity disorder, predominantly inattentive type: Secondary | ICD-10-CM | POA: Diagnosis not present

## 2018-07-23 ENCOUNTER — Encounter: Payer: Self-pay | Admitting: Internal Medicine

## 2018-07-24 DIAGNOSIS — J069 Acute upper respiratory infection, unspecified: Secondary | ICD-10-CM | POA: Diagnosis not present

## 2018-08-15 ENCOUNTER — Other Ambulatory Visit: Payer: Self-pay

## 2018-08-15 ENCOUNTER — Encounter: Payer: BLUE CROSS/BLUE SHIELD | Admitting: Internal Medicine

## 2018-09-01 ENCOUNTER — Other Ambulatory Visit: Payer: Self-pay

## 2018-09-01 ENCOUNTER — Encounter (HOSPITAL_COMMUNITY): Payer: Self-pay | Admitting: Family Medicine

## 2018-09-01 ENCOUNTER — Ambulatory Visit (HOSPITAL_COMMUNITY)
Admission: EM | Admit: 2018-09-01 | Discharge: 2018-09-01 | Disposition: A | Discharge status: Home / Self Care | Payer: BLUE CROSS/BLUE SHIELD | Attending: Family Medicine | Admitting: Family Medicine

## 2018-09-01 DIAGNOSIS — L03211 Cellulitis of face: Secondary | ICD-10-CM

## 2018-09-01 MED ORDER — DOXYCYCLINE HYCLATE 100 MG PO TABS: 100.0000 mg | ORAL_TABLET | Freq: Two times a day (BID) | ORAL | 0 refills | Status: AC

## 2018-09-01 NOTE — ED Triage Notes (Signed)
The patient presented to the [facility] with a complaint of pain, swelling and redness to the right side of his face x 2 days that he believed to be an insect bite.

## 2018-09-01 NOTE — Discharge Instructions (Signed)
Please use moist warm compress to the infected skin several times a day for 3 or 4 days.

## 2018-09-01 NOTE — ED Provider Notes (Signed)
[facility]    CSN: [id number] Arrival date & time: 09/01/18  1435     History   Chief Complaint Chief Complaint  Patient presents with  . Insect Bite    HPI Craig Weeks is a 28 y.o. male.   28 yo man presents to [facility] with self-described "bug bite on face."  Yesterday he noted firm area on right cheek and tried to squeeze it.  Then he applied ice, followed by mustard in cellophane.  This is his initial visit to this facility.     Past Medical History:  Diagnosis Date  . Asperger's disorder 05/01/2012  . Perforated eardrum    Right side    Patient Active Problem List   Diagnosis Date Noted  . Hyperglycemia 07/18/2017  . Wellness examination 07/18/2017  . Acute upper respiratory infection 08/20/2015  . Chronic cough 08/20/2015  . Allergic rhinitis 08/20/2015  . Ruptured or perforated eardrum 12/02/2012  . Autism spectrum disorder 05/01/2012  . 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 CIRCUMCISION     Age 74  . GANGLION CYST EXCISION     right       Home Medications    Prior to Admission medications   Medication Sig Start Date End Date Taking? Authorizing Provider  CONCERTA 54 MG CR tablet  10/29/16   [provider]  doxycycline (VIBRA-TABS) 100 MG tablet Take 1 tablet (100 mg total) by mouth 2 (two) times daily. 09/01/18   Elvina Sidle, MD    Family History Family History  Problem Relation Age of Onset  . ADD / ADHD Father   . Crohn's disease Father   . Kidney Stones Father   . Gallstones Father   . Prostate cancer Maternal Grandfather   . Bipolar disorder Maternal Grandmother   . Alcohol abuse Maternal Grandmother   . Dementia Paternal Grandfather   . Drug abuse Cousin     Social History Social History   Tobacco Use  . Smoking status: Never Smoker  . Smokeless tobacco: Never Used  Substance Use Topics  . Alcohol use: No     Comment: occasional social  . Drug use: No     Allergies   Eggs or egg-derived products; Lac bovis; Lactose; Yeast-related products; Milk-related compounds; and Yeast   Review of Systems Review of Systems  Constitutional: Negative.   Skin: Positive for rash.  All other systems reviewed and are negative.    Physical Exam Triage Vital Signs ED Triage Vitals  Enc Vitals Group     BP      Pulse      Resp      Temp      Temp src      SpO2      Weight      Height      Head Circumference      Peak Flow      Pain Score      Pain Loc      Pain Edu?      Excl. in GC?    No data found.  Updated Vital Signs BP 128/83 (BP Location: Left Arm)   Pulse 76   Temp 98.6 F (37 C) (Oral)   Resp 18   SpO2 95%    Physical Exam Vitals signs and nursing note reviewed.  Constitutional:      Appearance: Normal appearance. He is normal weight.  HENT:  Right Ear: External ear normal.     Left Ear: External ear normal.     Nose: Nose normal.     Mouth/Throat:     Mouth: Mucous membranes are moist.  Neck:     Musculoskeletal: Normal range of motion and neck supple.  Pulmonary:     Effort: Pulmonary effort is normal.  Musculoskeletal: Normal range of motion.  Skin:    General: Skin is warm and dry.     Comments: Minimal induration under red eschar right cheek  Neurological:     General: No focal deficit present.     Mental Status: He is alert and oriented to person, place, and time.  Psychiatric:        Mood and Affect: Mood normal.        Behavior: Behavior normal.        UC Treatments / Results  Labs (all labs ordered are listed, but only abnormal results are displayed) Labs Reviewed - No data to display  EKG None  Radiology No results found.  Procedures Procedures (including critical care time)  Medications Ordered in UC Medications - No data to display  Initial Impression / Assessment and Plan / UC Course  I have reviewed the triage vital  signs and the nursing notes.  Pertinent labs & imaging results that were available during my care of the patient were reviewed by me and considered in my medical decision making (see chart for details).    Final Clinical Impressions(s) / UC Diagnoses   Final diagnoses:  Cellulitis of face     Discharge Instructions     Please use moist warm compress to the infected skin several times a day for 3 or 4 days.    ED Prescriptions    Medication Sig Dispense Auth. Provider   doxycycline (VIBRA-TABS) 100 MG tablet Take 1 tablet (100 mg total) by mouth 2 (two) times daily. 20 tablet Elvina Sidle, MD     Controlled Substance Prescriptions [place] Controlled Substance Registry consulted? Not Applicable   Elvina Sidle, MD 09/01/18 [phone]

## 2018-10-02 DIAGNOSIS — F840 Autistic disorder: Secondary | ICD-10-CM | POA: Diagnosis not present

## 2018-10-02 DIAGNOSIS — Z7689 Persons encountering health services in other specified circumstances: Secondary | ICD-10-CM | POA: Diagnosis not present

## 2018-10-02 DIAGNOSIS — F900 Attention-deficit hyperactivity disorder, predominantly inattentive type: Secondary | ICD-10-CM | POA: Diagnosis not present

## 2018-10-14 DIAGNOSIS — F840 Autistic disorder: Secondary | ICD-10-CM | POA: Diagnosis not present

## 2018-10-14 DIAGNOSIS — F900 Attention-deficit hyperactivity disorder, predominantly inattentive type: Secondary | ICD-10-CM | POA: Diagnosis not present

## 2018-12-23 DIAGNOSIS — Z0000 Encounter for general adult medical examination without abnormal findings: Secondary | ICD-10-CM | POA: Diagnosis not present

## 2018-12-23 DIAGNOSIS — L720 Epidermal cyst: Secondary | ICD-10-CM | POA: Diagnosis not present

## 2018-12-25 DIAGNOSIS — F900 Attention-deficit hyperactivity disorder, predominantly inattentive type: Secondary | ICD-10-CM | POA: Diagnosis not present

## 2019-03-28 DIAGNOSIS — F900 Attention-deficit hyperactivity disorder, predominantly inattentive type: Secondary | ICD-10-CM | POA: Diagnosis not present

## 2019-04-04 DIAGNOSIS — L03032 Cellulitis of left toe: Secondary | ICD-10-CM | POA: Diagnosis not present

## 2019-04-04 DIAGNOSIS — L03031 Cellulitis of right toe: Secondary | ICD-10-CM | POA: Diagnosis not present

## 2019-08-21 ENCOUNTER — Ambulatory Visit: Payer: BLUE CROSS/BLUE SHIELD | Attending: Internal Medicine

## 2019-08-21 DIAGNOSIS — Z23 Encounter for immunization: Secondary | ICD-10-CM

## 2019-08-21 NOTE — Progress Notes (Signed)
   Covid-19 Vaccination Clinic  Name:  Craig Weeks    MRN: [id number] DOB: 10-Sep-1990  08/21/2019  [name] was observed post Covid-19 immunization for 15 minutes without incident. He was provided with Vaccine Information Sheet and instruction to access the V-Safe system.   [name] was instructed to call 911 with any severe reactions post vaccine: [name] Kitchen Difficulty breathing  . Swelling of face and throat  . A fast heartbeat  . A bad rash all over body  . Dizziness and weakness   Immunizations Administered    Name Date Dose VIS Date Route   Pfizer COVID-19 Vaccine 08/21/2019 10:34 AM 0.3 mL 05/09/2019 Intramuscular   Manufacturer: [employer], [employer]   Lot: [id number]   NDC: [address]-2

## 2019-09-15 ENCOUNTER — Ambulatory Visit: Payer: BLUE CROSS/BLUE SHIELD | Attending: Internal Medicine

## 2019-09-15 DIAGNOSIS — Z23 Encounter for immunization: Secondary | ICD-10-CM

## 2019-09-15 NOTE — Progress Notes (Signed)
   Covid-19 Vaccination Clinic  Name:  Craig Weeks    MRN: [id number] DOB: 1991-04-27  09/15/2019  Mr. [name] was observed post Covid-19 immunization for 15 minutes without incident. He was provided with Vaccine Information Sheet and instruction to access the V-Safe system.   Mr. [name] was instructed to call 911 with any severe reactions post vaccine: [name] Kitchen Difficulty breathing  . Swelling of face and throat  . A fast heartbeat  . A bad rash all over body  . Dizziness and weakness   Immunizations Administered    Name Date Dose VIS Date Route   Pfizer COVID-19 Vaccine 09/15/2019 10:52 AM 0.3 mL 07/23/2018 Intramuscular   Manufacturer: [employer], [employer]   Lot: [id number]   NDC: [address]-2
